# Patient Record
Sex: Female | Born: 1960
Health system: Southern US, Community
[De-identification: ages and names within clinical notes are randomized; demographics above are authoritative.]

## PROBLEM LIST (undated history)

## (undated) DIAGNOSIS — I10 Essential (primary) hypertension: Secondary | ICD-10-CM

## (undated) DIAGNOSIS — M1990 Unspecified osteoarthritis, unspecified site: Secondary | ICD-10-CM

## (undated) DIAGNOSIS — R011 Cardiac murmur, unspecified: Secondary | ICD-10-CM

## (undated) DIAGNOSIS — E079 Disorder of thyroid, unspecified: Secondary | ICD-10-CM

## (undated) HISTORY — DX: Cardiac murmur, unspecified: R01.1

## (undated) HISTORY — DX: Essential (primary) hypertension: I10

## (undated) HISTORY — DX: Disorder of thyroid, unspecified: E07.9

## (undated) HISTORY — PX: TOTAL ABDOMINAL HYSTERECTOMY: SHX209

## (undated) HISTORY — DX: Unspecified osteoarthritis, unspecified site: M19.90

## (undated) HISTORY — PX: ABDOMINAL HYSTERECTOMY: SHX81

---

## 2012-08-10 ENCOUNTER — Ambulatory Visit: Payer: Self-pay

## 2012-08-19 ENCOUNTER — Encounter: Payer: Self-pay | Admitting: *Deleted

## 2012-08-23 ENCOUNTER — Ambulatory Visit (INDEPENDENT_AMBULATORY_CARE_PROVIDER_SITE_OTHER): Payer: PRIVATE HEALTH INSURANCE | Admitting: General Surgery

## 2012-08-23 ENCOUNTER — Encounter: Payer: Self-pay | Admitting: General Surgery

## 2012-08-23 VITALS — BP 124/78 | HR 64 | Resp 14 | Ht 66.0 in | Wt 185.0 lb

## 2012-08-23 DIAGNOSIS — N63 Unspecified lump in unspecified breast: Secondary | ICD-10-CM

## 2012-08-23 NOTE — Progress Notes (Signed)
Patient ID: Allison Reynolds, female   DOB: [birth year], 52 y.o.   MRN: [id number]  Chief Complaint  Patient presents with  . Other    breast evaluation    HPI Allison Reynolds is a 52 y.o. female. Patient here today for breast evaluation referred by [facility] for a right breast mass. Mammogram and ultrasound 08-10-12 was normal. Patient states she can feel the "knot".  Denies family history of breast cancer. HPI  Past Medical History  Diagnosis Date  . Hypertension     Past Surgical History  Procedure Laterality Date  . Abdominal hysterectomy  age 86  . Cesarean section  1981, 1983    No family history on file.  Social History History  Substance Use Topics  . Smoking status: Current Every Day Smoker -- 2 years    Types: Cigarettes  . Smokeless tobacco: Never Used  . Alcohol Use: Yes     Comment: occasionally    No Known Allergies  Current Outpatient Prescriptions  Medication Sig Dispense Refill  . LISINOPRIL PO Take by mouth.      Marland Kitchen PROPRANOLOL HCL PO Take by mouth.       No current facility-administered medications for this visit.    Review of Systems Review of Systems  Constitutional: Negative.   Respiratory: Negative.   Cardiovascular: Negative.     Blood pressure 124/78, pulse 64, resp. rate 14, height 5\' 6"  (1.676 m), weight 185 lb (83.915 kg).  Physical Exam Physical Exam  Constitutional: She is oriented to person, place, and time. She appears well-developed and well-nourished.  Neck: No mass and no thyromegaly present.  Cardiovascular: Normal rate, regular rhythm and normal heart sounds.   Pulmonary/Chest: Effort normal and breath sounds normal. Right breast exhibits mass. Right breast exhibits no inverted nipple, no nipple discharge, no skin change and no tenderness. Left breast exhibits no inverted nipple, no nipple discharge, no skin change and no tenderness. Breasts are symmetrical.    Lymphadenopathy:    She has no cervical adenopathy.   She has no axillary adenopathy.  Neurological: She is alert and oriented to person, place, and time.  Skin: Skin is warm and dry.    Data Reviewed Mammogram and [place] of rightbreast reviewed-no abnormality  Assessment    Right breast mass, likely an area of fibrosis     Plan    Recommended FNA and completed today with pt's consent        Currie Paris 08/23/2012, 3:37 PM

## 2012-08-24 ENCOUNTER — Encounter: Payer: Self-pay | Admitting: General Surgery

## 2012-08-24 DIAGNOSIS — N63 Unspecified lump in unspecified breast: Secondary | ICD-10-CM | POA: Insufficient documentation

## 2012-08-25 ENCOUNTER — Encounter: Payer: Self-pay | Admitting: General Surgery

## 2012-08-31 ENCOUNTER — Telehealth: Payer: Self-pay | Admitting: *Deleted

## 2012-08-31 NOTE — Telephone Encounter (Signed)
Notified of benign breast results

## 2012-11-23 ENCOUNTER — Ambulatory Visit: Payer: PRIVATE HEALTH INSURANCE | Admitting: General Surgery

## 2012-12-29 ENCOUNTER — Ambulatory Visit: Payer: Self-pay | Admitting: General Surgery

## 2013-01-31 ENCOUNTER — Encounter: Payer: Self-pay | Admitting: *Deleted

## 2014-04-02 ENCOUNTER — Encounter: Payer: Self-pay | Admitting: General Surgery

## 2014-06-01 HISTORY — PX: BREAST BIOPSY: SHX20

## 2016-12-21 ENCOUNTER — Ambulatory Visit: Payer: Self-pay | Admitting: Unknown Physician Specialty

## 2017-01-12 ENCOUNTER — Ambulatory Visit (INDEPENDENT_AMBULATORY_CARE_PROVIDER_SITE_OTHER): Payer: BLUE CROSS/BLUE SHIELD | Admitting: Unknown Physician Specialty

## 2017-01-12 ENCOUNTER — Encounter: Payer: Self-pay | Admitting: Unknown Physician Specialty

## 2017-01-12 VITALS — BP 177/86 | HR 69 | Temp 98.4°F | Ht 65.6 in | Wt 191.8 lb

## 2017-01-12 DIAGNOSIS — Z7689 Persons encountering health services in other specified circumstances: Secondary | ICD-10-CM

## 2017-01-12 DIAGNOSIS — I10 Essential (primary) hypertension: Secondary | ICD-10-CM | POA: Diagnosis not present

## 2017-01-12 MED ORDER — LISINOPRIL-HYDROCHLOROTHIAZIDE 20-25 MG PO TABS: 1.0000 | ORAL_TABLET | Freq: Every day | ORAL | 0 refills | Status: DC

## 2017-01-12 MED ORDER — AMLODIPINE BESYLATE 5 MG PO TABS: 5.0000 mg | ORAL_TABLET | Freq: Every day | ORAL | 0 refills | Status: DC

## 2017-01-12 NOTE — Patient Instructions (Addendum)
Check insurance about hep C and HIV screening  Please do call to schedule your mammogram; the number to schedule one at either [facility] or [facility] is [phone]

## 2017-01-12 NOTE — Progress Notes (Signed)
BP (!) 177/86 (BP Location: Left Arm, Cuff Size: Large)   Pulse 69   Temp 98.4 F (36.9 C)   Ht 5' 5.6" (1.666 m)   Wt 191 lb 12.8 oz (87 kg)   LMP  (LMP Unknown)   SpO2 99%   BMI 31.34 kg/m    Subjective:    Patient ID: Allison Reynolds, female    DOB: [birth year], 58 y.o.   MRN: [id number]  HPI: Allison Reynolds is a 56 y.o. female  Chief Complaint  Patient presents with  . Establish Care  . Hypertension    pt states she used to be on lisinopril/hctz and amlodipine about a year ago    Hypertension Pt states she stopped taking her medications as she had to switch doctor's.  No side effects on those medications.   Average home BPs Not checking   No problems or lightheadedness No chest pain with exertion or shortness of breath No Edema No headaches.    Social History   Social History  . Marital status: Single    Spouse name: N/A  . Number of children: N/A  . Years of education: N/A   Occupational History  . Not on file.   Social History Main Topics  . Smoking status: Current Every Day Smoker    Years: 2.00    Types: Cigarettes  . Smokeless tobacco: Never Used     Comment: 1 pack per week  . Alcohol use Yes     Comment: occasionally  . Drug use: No  . Sexual activity: Yes   Other Topics Concern  . Not on file   Social History Narrative  . No narrative on file   Family History  Problem Relation Age of Onset  . Hypertension Mother   . Hypertension Father   . Heart murmur Father   . Hypertension Daughter   . Lupus Paternal Aunt     Past Medical History:  Diagnosis Date  . Arthritis   . Heart murmur   . Hypertension   . Thyroid disease    Past Surgical History:  Procedure Laterality Date  . ABDOMINAL HYSTERECTOMY  age 56  . CESAREAN SECTION  1981, 1983     Relevant past medical, surgical, family and social history reviewed and updated as indicated. Interim medical history since our last visit reviewed. Allergies and medications reviewed  and updated.  Review of Systems  Per HPI unless specifically indicated above     Objective:    BP (!) 177/86 (BP Location: Left Arm, Cuff Size: Large)   Pulse 69   Temp 98.4 F (36.9 C)   Ht 5' 5.6" (1.666 m)   Wt 191 lb 12.8 oz (87 kg)   LMP  (LMP Unknown)   SpO2 99%   BMI 31.34 kg/m   Wt Readings from Last 3 Encounters:  01/12/17 191 lb 12.8 oz (87 kg)  08/23/12 185 lb (83.9 kg)    Physical Exam  Constitutional: She is oriented to person, place, and time. She appears well-developed and well-nourished. No distress.  HENT:  Head: Normocephalic and atraumatic.  Eyes: Conjunctivae and lids are normal. Right eye exhibits no discharge. Left eye exhibits no discharge. No scleral icterus.  Neck: Normal range of motion. Neck supple. No JVD present. Carotid bruit is not present.  Cardiovascular: Normal rate, regular rhythm and normal heart sounds.   Pulmonary/Chest: Effort normal and breath sounds normal.  Abdominal: Normal appearance. There is no splenomegaly or hepatomegaly.  Musculoskeletal: Normal range of motion.  Neurological: She is alert and oriented to person, place, and time.  Skin: Skin is warm, dry and intact. No rash noted. No pallor.  Psychiatric: She has a normal mood and affect. Her behavior is normal. Judgment and thought content normal.      Assessment & Plan:   Problem List Items Addressed This Visit      Unprioritized   Encounter to establish care   Relevant Orders   MM DIGITAL SCREENING BILATERAL   Hypertension - Primary    Not to goal.  Restart both Lisinopril/HCTZ and Amlodipine      Relevant Medications   lisinopril-hydrochlorothiazide (PRINZIDE,ZESTORETIC) 20-25 MG tablet   amLODipine (NORVASC) 5 MG tablet   Other Relevant Orders   Comprehensive metabolic panel   TSH   Lipid Panel w/o Chol/HDL Ratio       Follow up plan: Return in about 4 weeks (around 02/09/2017).

## 2017-01-12 NOTE — Assessment & Plan Note (Signed)
Not to goal.  Restart both Lisinopril/HCTZ and Amlodipine

## 2017-01-13 LAB — LIPID PANEL W/O CHOL/HDL RATIO
Cholesterol, Total: 194 mg/dL (ref 100–199)
HDL: 75 mg/dL (ref >39)
LDL Calculated: 105 mg/dL — ABNORMAL HIGH (ref 0–99)
TRIGLYCERIDES: 68 mg/dL (ref 0–149)
VLDL CHOLESTEROL CAL: 14 mg/dL (ref 5–40)

## 2017-01-13 LAB — COMPREHENSIVE METABOLIC PANEL
ALBUMIN: 4.6 g/dL (ref 3.5–5.5)
ALT: 20 IU/L (ref 0–32)
AST: 21 IU/L (ref 0–40)
Albumin/Globulin Ratio: 1.8 (ref 1.2–2.2)
Alkaline Phosphatase: 70 IU/L (ref 39–117)
BUN / CREAT RATIO: 20 (ref 9–23)
BUN: 15 mg/dL (ref 6–24)
Bilirubin Total: 0.7 mg/dL (ref 0.0–1.2)
CALCIUM: 9.8 mg/dL (ref 8.7–10.2)
CHLORIDE: 101 mmol/L (ref 96–106)
CO2: 26 mmol/L (ref 20–29)
CREATININE: 0.76 mg/dL (ref 0.57–1.00)
GFR calc Af Amer: 101 mL/min/{1.73_m2} (ref >59)
GFR, EST NON AFRICAN AMERICAN: 87 mL/min/{1.73_m2} (ref >59)
GLOBULIN, TOTAL: 2.6 g/dL (ref 1.5–4.5)
GLUCOSE: 91 mg/dL (ref 65–99)
Potassium: 4.7 mmol/L (ref 3.5–5.2)
Sodium: 141 mmol/L (ref 134–144)
Total Protein: 7.2 g/dL (ref 6.0–8.5)

## 2017-01-13 LAB — TSH: TSH: 0.766 u[IU]/mL (ref 0.450–4.500)

## 2017-01-27 ENCOUNTER — Telehealth: Payer: Self-pay | Admitting: Family Medicine

## 2017-01-27 NOTE — Telephone Encounter (Signed)
Tried calling patient so Allison Reynolds could speak with her but she did not answer. I left her a VM letting her know that we would try to call her again Friday as Allison Reynolds is not in the office tomorrow.

## 2017-01-27 NOTE — Telephone Encounter (Signed)
Routing to provider. I do not see a mychart message or letter regarding labs. Please advise patient's other concern of dry mouth as well.

## 2017-01-27 NOTE — Telephone Encounter (Signed)
Patient would like a call back regarding her labs and also she has been having dry mouth and is wanting to know if there is something Allison MaxwellCheryl can recommend for it.  [phone]

## 2017-02-02 NOTE — Telephone Encounter (Signed)
Allison MaxwellCheryl would you like to contact this patient again?

## 2017-02-02 NOTE — Telephone Encounter (Signed)
No problem.  They were good and I will see her on f/u

## 2017-02-09 ENCOUNTER — Ambulatory Visit: Payer: BLUE CROSS/BLUE SHIELD | Admitting: Unknown Physician Specialty

## 2017-02-16 ENCOUNTER — Ambulatory Visit: Payer: BLUE CROSS/BLUE SHIELD | Admitting: Unknown Physician Specialty

## 2017-02-19 ENCOUNTER — Telehealth: Payer: Self-pay | Admitting: Unknown Physician Specialty

## 2017-02-19 NOTE — Telephone Encounter (Signed)
[facility] needing recent labs and proof of EKG faxed to facility.  Fax number: [phone]  Facility will fax request now.  Patient is at the office for a visit now.

## 2017-02-19 NOTE — Telephone Encounter (Signed)
No EKG in chart, labs faxed. Called and spoke to the office manager that called over. I let her know that we did not have an EKG but she stated that they went ahead and sent the patient to the hospital.

## 2017-02-23 ENCOUNTER — Ambulatory Visit (INDEPENDENT_AMBULATORY_CARE_PROVIDER_SITE_OTHER): Payer: BLUE CROSS/BLUE SHIELD | Admitting: Unknown Physician Specialty

## 2017-02-23 ENCOUNTER — Encounter: Payer: Self-pay | Admitting: Unknown Physician Specialty

## 2017-02-23 VITALS — BP 169/86 | HR 66 | Temp 97.9°F | Wt 192.6 lb

## 2017-02-23 DIAGNOSIS — I10 Essential (primary) hypertension: Secondary | ICD-10-CM | POA: Diagnosis not present

## 2017-02-23 DIAGNOSIS — R079 Chest pain, unspecified: Secondary | ICD-10-CM | POA: Diagnosis not present

## 2017-02-23 DIAGNOSIS — E785 Hyperlipidemia, unspecified: Secondary | ICD-10-CM | POA: Diagnosis not present

## 2017-02-23 DIAGNOSIS — R011 Cardiac murmur, unspecified: Secondary | ICD-10-CM

## 2017-02-23 MED ORDER — AMLODIPINE BESYLATE 10 MG PO TABS: 10.0000 mg | ORAL_TABLET | Freq: Every day | ORAL | 1 refills | Status: DC

## 2017-02-23 MED ORDER — ATORVASTATIN CALCIUM 20 MG PO TABS: 20.0000 mg | ORAL_TABLET | Freq: Every day | ORAL | 1 refills | Status: DC

## 2017-02-23 NOTE — Assessment & Plan Note (Signed)
Had for years.  Refer to cardiology in the setting of poorly controlled BP

## 2017-02-23 NOTE — Progress Notes (Signed)
BP (!) 169/86 (BP Location: Left Arm, Cuff Size: Normal)   Pulse 66   Temp 97.9 F (36.6 C)   Wt 192 lb 9.6 oz (87.4 kg)   LMP  (LMP Unknown)   SpO2 96%   BMI 31.47 kg/m    Subjective:    Patient ID: [name], female    DOB: August 29, 1960, 56 y.o.   MRN: [id number]  HPI: Allison Reynolds is a 56 y.o. female  Chief Complaint  Patient presents with  . Hypertension    4 week f/up   Hypertension Started on Lisinopril/HCTZ and Amlodipine last visit.  Takes medications daily.  Does not check BP at home.  States she went to urgent care for chest pain/heart racing/and breaking out in sweat.  EKG was normal.  Told to go to the ER but did not.  Her pain is better.  She thinks she overdid it and admits to being anxious  The 10-year ASCVD risk score Denman George DC Montez Hageman., et al., 2013) is: 20.5%   Values used to calculate the score:     Age: 67 years     Sex: Female     Is Non-Hispanic African American: Yes     Diabetic: No     Tobacco smoker: Yes     Systolic Blood Pressure: 169 mmHg     Is BP treated: Yes     HDL Cholesterol: 75 mg/dL     Total Cholesterol: 194 mg/dL   Relevant past medical, surgical, family and social history reviewed and updated as indicated. Interim medical history since our last visit reviewed. Allergies and medications reviewed and updated.  Review of Systems  Per HPI unless specifically indicated above     Objective:    BP (!) 169/86 (BP Location: Left Arm, Cuff Size: Normal)   Pulse 66   Temp 97.9 F (36.6 C)   Wt 192 lb 9.6 oz (87.4 kg)   LMP  (LMP Unknown)   SpO2 96%   BMI 31.47 kg/m   Wt Readings from Last 3 Encounters:  02/23/17 192 lb 9.6 oz (87.4 kg)  01/12/17 191 lb 12.8 oz (87 kg)  08/23/12 185 lb (83.9 kg)    Physical Exam  Constitutional: She is oriented to person, place, and time. She appears well-developed and well-nourished. No distress.  HENT:  Head: Normocephalic and atraumatic.  Eyes: Conjunctivae and lids are normal. Right  eye exhibits no discharge. Left eye exhibits no discharge. No scleral icterus.  Neck: Normal range of motion. Neck supple. No JVD present. Carotid bruit is not present.  Cardiovascular: Normal rate and regular rhythm.   Murmur heard.  Systolic murmur is present with a grade of 3/6  Pulmonary/Chest: Effort normal and breath sounds normal.  Abdominal: Normal appearance. There is no splenomegaly or hepatomegaly.  Musculoskeletal: Normal range of motion.  Neurological: She is alert and oriented to person, place, and time.  Skin: Skin is warm, dry and intact. No rash noted. No pallor.  Psychiatric: She has a normal mood and affect. Her behavior is normal. Judgment and thought content normal.   EKG reviewed from urgent care.  NSR without STTW changes------------------------------------------------------------------------------------  Results for orders placed or performed in visit on 01/12/17  Comprehensive metabolic panel  Result Value Ref Range   Glucose 91 65 - 99 mg/dL   BUN 15 6 - 24 mg/dL   Creatinine, Ser 4.09 0.57 - 1.00 mg/dL   GFR calc non Af Amer 87 >59 mL/min/1.73   GFR calc Af Denyse Dago  101 >59 mL/min/1.73   BUN/Creatinine Ratio 20 9 - 23   Sodium 141 134 - 144 mmol/L   Potassium 4.7 3.5 - 5.2 mmol/L   Chloride 101 96 - 106 mmol/L   CO2 26 20 - 29 mmol/L   Calcium 9.8 8.7 - 10.2 mg/dL   Total Protein 7.2 6.0 - 8.5 g/dL   Albumin 4.6 3.5 - 5.5 g/dL   Globulin, Total 2.6 1.5 - 4.5 g/dL   Albumin/Globulin Ratio 1.8 1.2 - 2.2   Bilirubin Total 0.7 0.0 - 1.2 mg/dL   Alkaline Phosphatase 70 39 - 117 IU/L   AST 21 0 - 40 IU/L   ALT 20 0 - 32 IU/L  TSH  Result Value Ref Range   TSH 0.766 0.450 - 4.500 uIU/mL  Lipid Panel w/o Chol/HDL Ratio  Result Value Ref Range   Cholesterol, Total 194 100 - 199 mg/dL   Triglycerides 68 0 - 149 mg/dL   HDL 75 >16 mg/dL   VLDL Cholesterol Cal 14 5 - 40 mg/dL   LDL Calculated 109 (H) 0 - 99 mg/dL      Assessment & Plan:   Problem List  Items Addressed This Visit      Unprioritized   Hyperlipidemia    ASCVD risk is 20.5%.  Start Atorvastain 20 mg      Relevant Medications   atorvastatin (LIPITOR) 20 MG tablet   amLODipine (NORVASC) 10 MG tablet   Hypertension    BP not to goal despite Amlodipine 5 mg and Lisinopril/HCTZ.  Increase Amlodipine to 10 mg.      Relevant Medications   atorvastatin (LIPITOR) 20 MG tablet   amLODipine (NORVASC) 10 MG tablet   Systolic murmur    Had for years.  Refer to cardiology in the setting of poorly controlled BP       Other Visit Diagnoses    Chest pain, unspecified type    -  Primary   Happened when working hard.  Refer to cardiology   Relevant Orders   Ambulatory referral to Cardiology       Follow up plan: Return in about 4 weeks (around 03/23/2017).

## 2017-02-23 NOTE — Assessment & Plan Note (Signed)
ASCVD risk is 20.5%.  Start Atorvastain 20 mg

## 2017-02-23 NOTE — Assessment & Plan Note (Signed)
BP not to goal despite Amlodipine 5 mg and Lisinopril/HCTZ.  Increase Amlodipine to 10 mg.

## 2017-03-30 ENCOUNTER — Ambulatory Visit: Payer: BLUE CROSS/BLUE SHIELD | Admitting: Unknown Physician Specialty

## 2017-04-25 ENCOUNTER — Other Ambulatory Visit: Payer: Self-pay | Admitting: Unknown Physician Specialty

## 2017-04-27 ENCOUNTER — Other Ambulatory Visit: Payer: Self-pay | Admitting: Unknown Physician Specialty

## 2017-04-27 ENCOUNTER — Ambulatory Visit: Payer: Self-pay | Admitting: *Deleted

## 2017-04-27 NOTE — Telephone Encounter (Signed)
Needs a refill of the Lisinopril and HCTZ.   She has an appt this Friday.   I will route a note to Gabriel Cirriheryl Wicker, NP to see if she can have a refill to get her through until Friday. She is going to call back and check on the status of the Rx.

## 2017-04-30 ENCOUNTER — Encounter: Payer: Self-pay | Admitting: Unknown Physician Specialty

## 2017-04-30 ENCOUNTER — Ambulatory Visit: Payer: BLUE CROSS/BLUE SHIELD | Admitting: Unknown Physician Specialty

## 2017-04-30 VITALS — BP 182/79 | HR 77 | Temp 97.6°F | Wt 194.8 lb

## 2017-04-30 DIAGNOSIS — Z1211 Encounter for screening for malignant neoplasm of colon: Secondary | ICD-10-CM

## 2017-04-30 DIAGNOSIS — I10 Essential (primary) hypertension: Secondary | ICD-10-CM

## 2017-04-30 DIAGNOSIS — E785 Hyperlipidemia, unspecified: Secondary | ICD-10-CM | POA: Diagnosis not present

## 2017-04-30 MED ORDER — CHLORTHALIDONE 25 MG PO TABS: 25.0000 mg | ORAL_TABLET | Freq: Every day | ORAL | 1 refills | Status: DC

## 2017-04-30 MED ORDER — LISINOPRIL 40 MG PO TABS: 40.0000 mg | ORAL_TABLET | Freq: Every day | ORAL | 3 refills | Status: DC

## 2017-04-30 NOTE — Progress Notes (Signed)
BP (!) 182/79   Pulse 77   Temp 97.6 F (36.4 C) (Oral)   Wt 194 lb 12.8 oz (88.4 kg)   LMP  (LMP Unknown)   SpO2 100%   BMI 31.83 kg/m    Subjective:    Patient ID: Allison Reynolds, female    DOB: 26-Jul-1960, 56 y.o.   MRN: [id number]  HPI: Allison Reynolds is a 56 y.o. female  Chief Complaint  Patient presents with  . Hyperlipidemia  . Hypertension   Hypertension Using medications without difficulty.  Taking Amlodipine plus Lisinopril/HCTZ Average home BPs High there as well   No problems or lightheadedness No chest pain with exertion or shortness of breath No Edema States when she loses weight, her BP goes down  Hyperlipidemia Last visit started on Atorvastatin Using medications without problems: No Muscle aches  Diet compliance:Exercise: Needs to exercise  Last visit pt was referred to the cardiologist for chest pain in the setting of difficult to control blood pressure.   Relevant past medical, surgical, family and social history reviewed and updated as indicated. Interim medical history since our last visit reviewed. Allergies and medications reviewed and updated.  Review of Systems  Constitutional: Negative.   HENT:       Bad tooth improved with Amoxil. She has a dental appt  Eyes: Negative.   Respiratory: Negative.   Cardiovascular: Negative.   Gastrointestinal: Negative.   Endocrine: Negative.   Genitourinary: Negative.   Musculoskeletal: Negative.   Skin: Negative.   Allergic/Immunologic: Negative.   Neurological: Negative.   Hematological: Negative.   Psychiatric/Behavioral: Negative.     Per HPI unless specifically indicated above     Objective:    BP (!) 182/79   Pulse 77   Temp 97.6 F (36.4 C) (Oral)   Wt 194 lb 12.8 oz (88.4 kg)   LMP  (LMP Unknown)   SpO2 100%   BMI 31.83 kg/m   Wt Readings from Last 3 Encounters:  04/30/17 194 lb 12.8 oz (88.4 kg)  02/23/17 192 lb 9.6 oz (87.4 kg)  01/12/17 191 lb 12.8 oz (87 kg)      Physical Exam  Constitutional: She is oriented to person, place, and time. She appears well-developed and well-nourished. No distress.  HENT:  Head: Normocephalic and atraumatic.  Eyes: Conjunctivae and lids are normal. Right eye exhibits no discharge. Left eye exhibits no discharge. No scleral icterus.  Neck: Normal range of motion. Neck supple. No JVD present. Carotid bruit is not present.  Cardiovascular: Normal rate, regular rhythm and normal heart sounds.  Pulmonary/Chest: Effort normal and breath sounds normal.  Abdominal: Normal appearance. There is no splenomegaly or hepatomegaly.  Musculoskeletal: Normal range of motion.  Neurological: She is alert and oriented to person, place, and time.  Skin: Skin is warm, dry and intact. No rash noted. No pallor.  Psychiatric: She has a normal mood and affect. Her behavior is normal. Judgment and thought content normal.    Results for orders placed or performed in visit on 01/12/17  Comprehensive metabolic panel  Result Value Ref Range   Glucose 91 65 - 99 mg/dL   BUN 15 6 - 24 mg/dL   Creatinine, Ser 9.140.76 0.57 - 1.00 mg/dL   GFR calc non Af Amer 87 >59 mL/min/1.73   GFR calc Af Amer 101 >59 mL/min/1.73   BUN/Creatinine Ratio 20 9 - 23   Sodium 141 134 - 144 mmol/L   Potassium 4.7 3.5 - 5.2 mmol/L   Chloride 101  96 - 106 mmol/L   CO2 26 20 - 29 mmol/L   Calcium 9.8 8.7 - 10.2 mg/dL   Total Protein 7.2 6.0 - 8.5 g/dL   Albumin 4.6 3.5 - 5.5 g/dL   Globulin, Total 2.6 1.5 - 4.5 g/dL   Albumin/Globulin Ratio 1.8 1.2 - 2.2   Bilirubin Total 0.7 0.0 - 1.2 mg/dL   Alkaline Phosphatase 70 39 - 117 IU/L   AST 21 0 - 40 IU/L   ALT 20 0 - 32 IU/L  TSH  Result Value Ref Range   TSH 0.766 0.450 - 4.500 uIU/mL  Lipid Panel w/o Chol/HDL Ratio  Result Value Ref Range   Cholesterol, Total 194 100 - 199 mg/dL   Triglycerides 68 0 - 149 mg/dL   HDL 75 >16>39 mg/dL   VLDL Cholesterol Cal 14 5 - 40 mg/dL   LDL Calculated 109105 (H) 0 - 99 mg/dL       Assessment & Plan:   Problem List Items Addressed This Visit      Unprioritized   Hyperlipidemia    Tolerating Atorvastatin well.  Will check Lipid panel next visit at physical      Relevant Medications   chlorthalidone (HYGROTON) 25 MG tablet   lisinopril (PRINIVIL,ZESTRIL) 40 MG tablet   Hypertension    BP under poor control without benefit of additional medication.  Change Lisinopril/Hctz to Lisinopril 40 and chlorthalidone 25 mg. Refusing cardiology consult at this time      Relevant Medications   chlorthalidone (HYGROTON) 25 MG tablet   lisinopril (PRINIVIL,ZESTRIL) 40 MG tablet    Other Visit Diagnoses    Colon cancer screening    -  Primary   Relevant Orders   Cologuard      HM: Schedule for Cologuard.  Told ot call insurance for coverage.    Follow up plan: Return in about 4 weeks (around 05/28/2017) for physical.

## 2017-04-30 NOTE — Patient Instructions (Signed)
Stop Lisinopril/HCTZ  Start Lisinopril 40 mg and Chlorthalidone 25 mg

## 2017-04-30 NOTE — Assessment & Plan Note (Signed)
Tolerating Atorvastatin well.  Will check Lipid panel next visit at physical

## 2017-04-30 NOTE — Assessment & Plan Note (Addendum)
BP under poor control without benefit of additional medication.  Change Lisinopril/Hctz to Lisinopril 40 and chlorthalidone 25 mg. Refusing cardiology consult at this time

## 2017-06-01 ENCOUNTER — Other Ambulatory Visit: Payer: Self-pay | Admitting: Unknown Physician Specialty

## 2017-06-04 ENCOUNTER — Telehealth: Payer: Self-pay

## 2017-06-04 DIAGNOSIS — Z1211 Encounter for screening for malignant neoplasm of colon: Secondary | ICD-10-CM

## 2017-06-04 NOTE — Telephone Encounter (Signed)
Copied from CRM [id number]. Topic: Inquiry >> Jun 04, 2017  2:31 PM Eston Mouldavis, Cheri B wrote: Reason for CRM: Pt received colorguard in mail, her insurance will not cover unless its done at facility in network.  PT is asking what should she do  now   Routing to provider. What can we tell the patient?

## 2017-06-04 NOTE — Telephone Encounter (Signed)
It means her insurance does not cover Cologuard and she needs a colonoscopy scheduled

## 2017-06-04 NOTE — Telephone Encounter (Signed)
Called and spoke with patient. She is OK with having colonoscopy. Routing back to provider for referral.

## 2017-06-14 ENCOUNTER — Other Ambulatory Visit: Payer: Self-pay

## 2017-06-14 ENCOUNTER — Telehealth: Payer: Self-pay | Admitting: Gastroenterology

## 2017-06-14 ENCOUNTER — Telehealth: Payer: Self-pay

## 2017-06-14 DIAGNOSIS — Z1211 Encounter for screening for malignant neoplasm of colon: Secondary | ICD-10-CM

## 2017-06-14 NOTE — Telephone Encounter (Signed)
Gastroenterology Pre-Procedure Review  Request Date: 06/23/17 Requesting Physician: Dr. Allegra [facility]aiVanga  PATIENT REVIEW QUESTIONS: The patient responded to the following health history questions as indicated:    1. Are you having any GI issues? no 2. Do you have a personal history of Polyps? no 3. Do you have a family history of Colon Cancer or Polyps? no 4. Diabetes Mellitus? no 5. Joint replacements in the past 12 months?no 6. Major health problems in the past 3 months?no 7. Any artificial heart valves, MVP, or defibrillator?no    MEDICATIONS & ALLERGIES:    Patient reports the following regarding taking any anticoagulation/antiplatelet therapy:   Plavix, Coumadin, Eliquis, Xarelto, Lovenox, Pradaxa, Brilinta, or Effient? no Aspirin? no  Patient confirms/reports the following medications:  Current Outpatient Medications  Medication Sig Dispense Refill  . amLODipine (NORVASC) 10 MG tablet TAKE 1 TABLET BY MOUTH ONCE DAILY 30 tablet 1  . atorvastatin (LIPITOR) 20 MG tablet Take 1 tablet (20 mg total) by mouth daily. 30 tablet 1  . chlorthalidone (HYGROTON) 25 MG tablet Take 1 tablet (25 mg total) by mouth daily. 30 tablet 1  . lisinopril (PRINIVIL,ZESTRIL) 40 MG tablet Take 1 tablet (40 mg total) by mouth daily. 90 tablet 3   No current facility-administered medications for this visit.     Patient confirms/reports the following allergies:  No Known Allergies  No orders of the defined types were placed in this encounter.   AUTHORIZATION INFORMATION Primary Insurance: 1D#: Group #:  Secondary Insurance: 1D#: Group #:  SCHEDULE INFORMATION: Date: 06/23/17 Time: Location:[place]

## 2017-06-14 NOTE — Telephone Encounter (Signed)
Patient to see about arranging transportation and will call back to schedule her colonoscopy.

## 2017-06-14 NOTE — Telephone Encounter (Signed)
Gastroenterology Pre-Procedure Review  Request Date:  Requesting Physician: Dr.   PATIENT REVIEW QUESTIONS: The patient responded to the following health history questions as indicated:    1. Are you having any GI issues? No  2. Do you have a personal history of Polyps? No  3. Do you have a family history of Colon Cancer or Polyps? No  4. Diabetes Mellitus? No  5. Joint replacements in the past 12 months? No  6. Major health problems in the past 3 months? No  7. Any artificial heart valves, MVP, or defibrillator? No     MEDICATIONS & ALLERGIES:    Patient reports the following regarding taking any anticoagulation/antiplatelet therapy:   Plavix, Coumadin, Eliquis, Xarelto, Lovenox, Pradaxa, Brilinta, or Effient? No  Aspirin? No    Patient confirms/reports the following medications:  Current Outpatient Medications  Medication Sig Dispense Refill  . amLODipine (NORVASC) 10 MG tablet TAKE 1 TABLET BY MOUTH ONCE DAILY 30 tablet 1  . atorvastatin (LIPITOR) 20 MG tablet Take 1 tablet (20 mg total) by mouth daily. 30 tablet 1  . chlorthalidone (HYGROTON) 25 MG tablet Take 1 tablet (25 mg total) by mouth daily. 30 tablet 1  . lisinopril (PRINIVIL,ZESTRIL) 40 MG tablet Take 1 tablet (40 mg total) by mouth daily. 90 tablet 3   No current facility-administered medications for this visit.     Patient confirms/reports the following allergies:  No Known Allergies  No orders of the defined types were placed in this encounter.   AUTHORIZATION INFORMATION Primary Insurance: 1D#: Group #:  Secondary Insurance: 1D#: Group #:  SCHEDULE INFORMATION: Date:  Time: Location:

## 2017-06-14 NOTE — Telephone Encounter (Signed)
Patient is returning a call to schedule a colonoscopy °

## 2017-06-15 ENCOUNTER — Ambulatory Visit: Payer: BLUE CROSS/BLUE SHIELD | Admitting: Unknown Physician Specialty

## 2017-06-15 ENCOUNTER — Encounter: Payer: Self-pay | Admitting: Unknown Physician Specialty

## 2017-06-15 VITALS — BP 149/86 | HR 75 | Temp 97.7°F | Ht 65.5 in | Wt 192.3 lb

## 2017-06-15 DIAGNOSIS — R002 Palpitations: Secondary | ICD-10-CM | POA: Diagnosis not present

## 2017-06-15 DIAGNOSIS — E785 Hyperlipidemia, unspecified: Secondary | ICD-10-CM | POA: Diagnosis not present

## 2017-06-15 DIAGNOSIS — I10 Essential (primary) hypertension: Secondary | ICD-10-CM | POA: Diagnosis not present

## 2017-06-15 DIAGNOSIS — Z0000 Encounter for general adult medical examination without abnormal findings: Secondary | ICD-10-CM

## 2017-06-15 DIAGNOSIS — I493 Ventricular premature depolarization: Secondary | ICD-10-CM

## 2017-06-15 DIAGNOSIS — Z0001 Encounter for general adult medical examination with abnormal findings: Secondary | ICD-10-CM | POA: Diagnosis not present

## 2017-06-15 MED ORDER — CHLORTHALIDONE 25 MG PO TABS: 25.0000 mg | ORAL_TABLET | Freq: Every day | ORAL | 1 refills | Status: DC

## 2017-06-15 NOTE — Progress Notes (Signed)
BP (!) 149/86 (BP Location: Left Arm, Cuff Size: Large)   Pulse 75   Temp 97.7 F (36.5 C) (Oral)   Ht 5' 5.5" (1.664 m)   Wt 192 lb 4.8 oz (87.2 kg)   LMP  (LMP Unknown)   SpO2 96%   BMI 31.51 kg/m    Subjective:    Patient ID: Allison Reynolds, female    DOB: 1960-07-25, 57 y.o.   MRN: [id number]  HPI: Allison Reynolds is a 57 y.o. female  Chief Complaint  Patient presents with  . Annual Exam   HypertensionI Pt is taking Amlodipine and Chlorthalidone.  Not taking Lisinopril at this time Using medications without difficulty Average home BPs this AM SBP was 168 in one arm and 158/80 in the other arm  No problems or lightheadedness No chest pain with exertion or shortness of breath No Edema   Hyperlipidemia Using medications without problems: No Muscle aches  Diet compliance:Exercise: dong some exercise.    Depression screen [facility] 2/9 06/15/2017 01/12/2017  Decreased Interest 0 0  Down, Depressed, Hopeless 0 2  PHQ - 2 Score 0 2  Altered sleeping 1 3  Tired, decreased energy 1 0  Change in appetite 0 0  Feeling bad or failure about yourself  0 2  Trouble concentrating 0 0  Moving slowly or fidgety/restless 0 0  Suicidal thoughts 0 1  PHQ-9 Score 2 8   Social History   Socioeconomic History  . Marital status: Reynolds    Spouse name: Not on file  . Number of children: Not on file  . Years of education: Not on file  . Highest education level: Not on file  Social Needs  . Financial resource strain: Not on file  . Food insecurity - worry: Not on file  . Food insecurity - inability: Not on file  . Transportation needs - medical: Not on file  . Transportation needs - non-medical: Not on file  Occupational History  . Not on file  Tobacco Use  . Smoking status: Current Every Day Smoker    Years: 2.00    Types: Cigarettes  . Smokeless tobacco: Never Used  . Tobacco comment: 1 pack per week  Substance and Sexual Activity  . Alcohol use: Yes    Comment:  occasionally  . Drug use: No  . Sexual activity: Yes  Other Topics Concern  . Not on file  Social History Narrative  . Not on file   Family History  Problem Relation Age of Onset  . Hypertension Mother   . Hypertension Father   . Heart murmur Father   . Hypertension Daughter   . Lupus Paternal Aunt    Past Surgical History:  Procedure Laterality Date  . ABDOMINAL HYSTERECTOMY  age 6  . CESAREAN SECTION  1981, 1983   Past Medical History:  Diagnosis Date  . Arthritis   . Heart murmur   . Hypertension   . Thyroid disease      Relevant past medical, surgical, family and social history reviewed and updated as indicated. Interim medical history since our last visit reviewed. Allergies and medications reviewed and updated.  Review of Systems  Constitutional: Negative.   HENT: Negative.   Eyes: Negative.   Respiratory: Negative.   Cardiovascular: Negative.   Gastrointestinal: Negative.   Endocrine: Negative.   Genitourinary: Negative.   Musculoskeletal: Negative.   Skin: Negative.   Allergic/Immunologic: Negative.   Neurological: Negative.   Hematological: Negative.   Psychiatric/Behavioral: Negative.  Per HPI unless specifically indicated above     Objective:    BP (!) 149/86 (BP Location: Left Arm, Cuff Size: Large)   Pulse 75   Temp 97.7 F (36.5 C) (Oral)   Ht 5' 5.5" (1.664 m)   Wt 192 lb 4.8 oz (87.2 kg)   LMP  (LMP Unknown)   SpO2 96%   BMI 31.51 kg/m   Wt Readings from Last 3 Encounters:  06/15/17 192 lb 4.8 oz (87.2 kg)  04/30/17 194 lb 12.8 oz (88.4 kg)  02/23/17 192 lb 9.6 oz (87.4 kg)    Physical Exam  Constitutional: She is oriented to person, place, and time. She appears well-developed and well-nourished. No distress.  HENT:  Head: Normocephalic and atraumatic.  Eyes: Conjunctivae and lids are normal. Right eye exhibits no discharge. Left eye exhibits no discharge. No scleral icterus.  Neck: Normal range of motion. Neck supple. No  JVD present. Carotid bruit is not present.  Cardiovascular: Normal rate and normal heart sounds. An irregular rhythm present.  EKG shows ventricular trigeminy.  Possible ST changes  Pulmonary/Chest: Effort normal and breath sounds normal.  Abdominal: Normal appearance. There is no splenomegaly or hepatomegaly.  Musculoskeletal: Normal range of motion.  Neurological: She is alert and oriented to person, place, and time.  Skin: Skin is warm, dry and intact. No rash noted. No pallor.  Psychiatric: She has a normal mood and affect. Her behavior is normal. Judgment and thought content normal.    Results for orders placed or performed in visit on 01/12/17  Comprehensive metabolic panel  Result Value Ref Range   Glucose 91 65 - 99 mg/dL   BUN 15 6 - 24 mg/dL   Creatinine, Ser 4.78 0.57 - 1.00 mg/dL   GFR calc non Af Amer 87 >59 mL/min/1.73   GFR calc Af Amer 101 >59 mL/min/1.73   BUN/Creatinine Ratio 20 9 - 23   Sodium 141 134 - 144 mmol/L   Potassium 4.7 3.5 - 5.2 mmol/L   Chloride 101 96 - 106 mmol/L   CO2 26 20 - 29 mmol/L   Calcium 9.8 8.7 - 10.2 mg/dL   Total Protein 7.2 6.0 - 8.5 g/dL   Albumin 4.6 3.5 - 5.5 g/dL   Globulin, Total 2.6 1.5 - 4.5 g/dL   Albumin/Globulin Ratio 1.8 1.2 - 2.2   Bilirubin Total 0.7 0.0 - 1.2 mg/dL   Alkaline Phosphatase 70 39 - 117 IU/L   AST 21 0 - 40 IU/L   ALT 20 0 - 32 IU/L  TSH  Result Value Ref Range   TSH 0.766 0.450 - 4.500 uIU/mL  Lipid Panel w/o Chol/HDL Ratio  Result Value Ref Range   Cholesterol, Total 194 100 - 199 mg/dL   Triglycerides 68 0 - 149 mg/dL   HDL 75 >29 mg/dL   VLDL Cholesterol Cal 14 5 - 40 mg/dL   LDL Calculated 562 (H) 0 - 99 mg/dL      Assessment & Plan:   Problem List Items Addressed This Visit      Unprioritized   Hyperlipidemia   Relevant Medications   chlorthalidone (HYGROTON) 25 MG tablet   Other Relevant Orders   Lipid Panel w/o Chol/HDL Ratio   Hypertension    Improved but not to goal.  Restart  Lisinopril along with Amlodipine and Chlorthalidone      Relevant Medications   chlorthalidone (HYGROTON) 25 MG tablet   Other Relevant Orders   Comprehensive metabolic panel   TSH  PVC (premature ventricular contraction)    With questionable ST depression.  Due to high risk, refer to cardiology      Relevant Medications   chlorthalidone (HYGROTON) 25 MG tablet   Other Relevant Orders   Ambulatory referral to Cardiology    Other Visit Diagnoses    Palpitations    -  Primary   Relevant Orders   TSH   EKG 12-Lead (Completed)   Ambulatory referral to Cardiology   Routine general medical examination at a health care facility       Relevant Orders   Hepatitis C antibody   HIV antibody   CBC with Differential/Platelet   MM DIGITAL SCREENING BILATERAL       Follow up plan: Return in about 4 weeks (around 07/13/2017).

## 2017-06-15 NOTE — Assessment & Plan Note (Signed)
Improved but not to goal.  Restart Lisinopril along with Amlodipine and Chlorthalidone

## 2017-06-15 NOTE — Patient Instructions (Signed)
Please do call to schedule your mammogram; the number to schedule one at either [facility] or [facility] is [phone]   

## 2017-06-15 NOTE — Assessment & Plan Note (Signed)
With questionable ST depression.  Due to high risk, refer to cardiology

## 2017-06-16 ENCOUNTER — Encounter: Payer: Self-pay | Admitting: Unknown Physician Specialty

## 2017-06-16 LAB — CBC WITH DIFFERENTIAL/PLATELET
BASOS ABS: 0 10*3/uL (ref 0.0–0.2)
Basos: 1 %
EOS (ABSOLUTE): 0 10*3/uL (ref 0.0–0.4)
EOS: 1 %
HEMATOCRIT: 40.2 % (ref 34.0–46.6)
HEMOGLOBIN: 13.2 g/dL (ref 11.1–15.9)
Immature Grans (Abs): 0 10*3/uL (ref 0.0–0.1)
Immature Granulocytes: 0 %
Lymphocytes Absolute: 1.7 10*3/uL (ref 0.7–3.1)
Lymphs: 33 %
MCH: 27.3 pg (ref 26.6–33.0)
MCHC: 32.8 g/dL (ref 31.5–35.7)
MCV: 83 fL (ref 79–97)
MONOCYTES: 8 %
MONOS ABS: 0.4 10*3/uL (ref 0.1–0.9)
NEUTROS ABS: 2.9 10*3/uL (ref 1.4–7.0)
Neutrophils: 57 %
Platelets: 297 10*3/uL (ref 150–379)
RBC: 4.84 x10E6/uL (ref 3.77–5.28)
RDW: 12.8 % (ref 12.3–15.4)
WBC: 5 10*3/uL (ref 3.4–10.8)

## 2017-06-16 LAB — COMPREHENSIVE METABOLIC PANEL
ALBUMIN: 4.9 g/dL (ref 3.5–5.5)
ALK PHOS: 65 IU/L (ref 39–117)
ALT: 27 IU/L (ref 0–32)
AST: 27 IU/L (ref 0–40)
Albumin/Globulin Ratio: 2 (ref 1.2–2.2)
BUN / CREAT RATIO: 14 (ref 9–23)
BUN: 10 mg/dL (ref 6–24)
Bilirubin Total: 0.6 mg/dL (ref 0.0–1.2)
CO2: 27 mmol/L (ref 20–29)
Calcium: 9.8 mg/dL (ref 8.7–10.2)
Chloride: 95 mmol/L — ABNORMAL LOW (ref 96–106)
Creatinine, Ser: 0.69 mg/dL (ref 0.57–1.00)
GFR calc Af Amer: 111 mL/min/{1.73_m2} (ref >59)
GFR calc non Af Amer: 96 mL/min/{1.73_m2} (ref >59)
GLOBULIN, TOTAL: 2.5 g/dL (ref 1.5–4.5)
Glucose: 98 mg/dL (ref 65–99)
POTASSIUM: 3.4 mmol/L — AB (ref 3.5–5.2)
SODIUM: 139 mmol/L (ref 134–144)
Total Protein: 7.4 g/dL (ref 6.0–8.5)

## 2017-06-16 LAB — TSH: TSH: 0.758 u[IU]/mL (ref 0.450–4.500)

## 2017-06-16 LAB — LIPID PANEL W/O CHOL/HDL RATIO
CHOLESTEROL TOTAL: 156 mg/dL (ref 100–199)
HDL: 71 mg/dL (ref >39)
LDL CALC: 75 mg/dL (ref 0–99)
TRIGLYCERIDES: 48 mg/dL (ref 0–149)
VLDL Cholesterol Cal: 10 mg/dL (ref 5–40)

## 2017-06-16 LAB — HIV ANTIBODY (ROUTINE TESTING W REFLEX): HIV Screen 4th Generation wRfx: NONREACTIVE

## 2017-06-16 LAB — HEPATITIS C ANTIBODY: Hep C Virus Ab: <0.1 s/co ratio (ref 0.0–0.9)

## 2017-06-16 NOTE — Progress Notes (Signed)
Normal labs.  Patient notified by letter.

## 2017-07-06 NOTE — Discharge Instructions (Signed)
General Anesthesia, Adult, Care After °These instructions provide you with information about caring for yourself after your procedure. Your health care provider may also give you more specific instructions. Your treatment has been planned according to current medical practices, but problems sometimes occur. Call your health care provider if you have any problems or questions after your procedure. °What can I expect after the procedure? °After the procedure, it is common to have: °· Vomiting. °· A sore throat. °· Mental slowness. ° °It is common to feel: °· Nauseous. °· Cold or shivery. °· Sleepy. °· Tired. °· Sore or achy, even in parts of your body where you did not have surgery. ° °Follow these instructions at home: °For at least 24 hours after the procedure: °· Do not: °? Participate in activities where you could fall or become injured. °? Drive. °? Use heavy machinery. °? Drink alcohol. °? Take sleeping pills or medicines that cause drowsiness. °? Make important decisions or sign legal documents. °? Take care of children on your own. °· Rest. °Eating and drinking °· If you vomit, drink water, juice, or soup when you can drink without vomiting. °· Drink enough fluid to keep your urine clear or pale yellow. °· Make sure you have little or no nausea before eating solid foods. °· Follow the diet recommended by your health care provider. °General instructions °· Have a responsible adult stay with you until you are awake and alert. °· Return to your normal activities as told by your health care provider. Ask your health care provider what activities are safe for you. °· Take over-the-counter and prescription medicines only as told by your health care provider. °· If you smoke, do not smoke without supervision. °· Keep all follow-up visits as told by your health care provider. This is important. °Contact a health care provider if: °· You continue to have nausea or vomiting at home, and medicines are not helpful. °· You  cannot drink fluids or start eating again. °· You cannot urinate after 8-12 hours. °· You develop a skin rash. °· You have fever. °· You have increasing redness at the site of your procedure. °Get help right away if: °· You have difficulty breathing. °· You have chest pain. °· You have unexpected bleeding. °· You feel that you are having a life-threatening or urgent problem. °This information is not intended to replace advice given to you by your health care provider. Make sure you discuss any questions you have with your health care provider. °Document Released: 08/24/2000 Document Revised: 10/21/2015 Document Reviewed: 05/02/2015 °Elsevier Interactive Patient Education © 2018 [employer] ° °

## 2017-07-07 ENCOUNTER — Ambulatory Visit
Admission: RE | Admit: 2017-07-07 | Payer: BLUE CROSS/BLUE SHIELD | Source: Ambulatory Visit | Admitting: Gastroenterology

## 2017-07-07 ENCOUNTER — Encounter: Admission: RE | Payer: Self-pay | Source: Ambulatory Visit

## 2017-07-07 SURGERY — COLONOSCOPY WITH PROPOFOL
Anesthesia: General

## 2017-07-13 ENCOUNTER — Ambulatory Visit: Payer: BLUE CROSS/BLUE SHIELD | Admitting: Unknown Physician Specialty

## 2017-07-15 ENCOUNTER — Ambulatory Visit
Admission: RE | Admit: 2017-07-15 | Discharge: 2017-07-15 | Disposition: A | Discharge status: Home / Self Care | Payer: BLUE CROSS/BLUE SHIELD | Source: Ambulatory Visit | Attending: Unknown Physician Specialty | Admitting: Unknown Physician Specialty

## 2017-07-15 DIAGNOSIS — Z0000 Encounter for general adult medical examination without abnormal findings: Secondary | ICD-10-CM

## 2017-07-15 DIAGNOSIS — Z1231 Encounter for screening mammogram for malignant neoplasm of breast: Secondary | ICD-10-CM | POA: Diagnosis present

## 2017-08-11 ENCOUNTER — Telehealth: Payer: Self-pay | Admitting: Unknown Physician Specialty

## 2017-08-11 NOTE — Telephone Encounter (Signed)
Needs follow up appointment.  

## 2017-08-11 NOTE — Telephone Encounter (Signed)
Pt calling back about medicine until her appt

## 2017-08-11 NOTE — Telephone Encounter (Signed)
Pt has appt scheduled on 3/19. Pt states she will be out of medication before seen for appt and wants to know if she could be given enough medication to last until seen at appt. Pt requesting temporary supply of Amlodipine and Atorvastatin.

## 2017-08-11 NOTE — Telephone Encounter (Signed)
Left message for patient to call to schedule follow up appointment for the medication refills

## 2017-08-12 NOTE — Telephone Encounter (Signed)
Patient calling checking status. Please advise [phone]

## 2017-08-12 NOTE — Telephone Encounter (Signed)
Spoke with patient to have her contact her pharmacy regarding the refills sent in on 3/13

## 2017-08-17 ENCOUNTER — Encounter: Payer: Self-pay | Admitting: Family Medicine

## 2017-08-17 ENCOUNTER — Ambulatory Visit (INDEPENDENT_AMBULATORY_CARE_PROVIDER_SITE_OTHER): Payer: BLUE CROSS/BLUE SHIELD | Admitting: Family Medicine

## 2017-08-17 VITALS — BP 157/91 | HR 61 | Temp 98.7°F | Wt 193.4 lb

## 2017-08-17 DIAGNOSIS — E785 Hyperlipidemia, unspecified: Secondary | ICD-10-CM

## 2017-08-17 DIAGNOSIS — I10 Essential (primary) hypertension: Secondary | ICD-10-CM

## 2017-08-17 DIAGNOSIS — I493 Ventricular premature depolarization: Secondary | ICD-10-CM

## 2017-08-17 MED ORDER — AMLODIPINE BESYLATE 10 MG PO TABS: 10.0000 mg | ORAL_TABLET | Freq: Every day | ORAL | 1 refills | Status: DC

## 2017-08-17 MED ORDER — LISINOPRIL 10 MG PO TABS: 10.0000 mg | ORAL_TABLET | Freq: Every day | ORAL | 3 refills | Status: DC

## 2017-08-17 MED ORDER — ATORVASTATIN CALCIUM 20 MG PO TABS: 20.0000 mg | ORAL_TABLET | Freq: Every day | ORAL | 1 refills | Status: DC

## 2017-08-17 NOTE — Assessment & Plan Note (Signed)
Not interested in seeing cardiology at this time. Warning signs discussed. Will call with any concerns. Rhythm normal today.

## 2017-08-17 NOTE — Assessment & Plan Note (Signed)
Not under good control. Did not take her lisinopril. Will refill lisinopril, continue amlodipine and chlorthalidone and recheck 1 month. Call with any concerns. Will check BMP at that time.

## 2017-08-17 NOTE — Assessment & Plan Note (Signed)
Normal last visit. Doing well on the lipitor. Continue to monitor. Refill given. Recheck 1 month.

## 2017-08-17 NOTE — Progress Notes (Signed)
BP (!) 157/91 (BP Location: Left Arm, Patient Position: Sitting, Cuff Size: Normal)   Pulse 61   Temp 98.7 F (37.1 C) (Oral)   Wt 193 lb 7 oz (87.7 kg)   LMP  (LMP Unknown)   SpO2 100%   BMI 31.70 kg/m    Subjective:    Patient ID: Allison Reynolds, female    DOB: [birth year], 57 y.o.   MRN: [id number]  HPI: Allison Reynolds is a 57 y.o. female  Chief Complaint  Patient presents with  . Hypertension    follow-up   Patient presents today for evaluation as she states she does not want to see previous provider. She notes that she did not restart her lisinopril. Went to the pharmacy to pick it up, but states that it was not there. Has been taking her chlorthalidone and amlodipine. Last visit was complaining of palpitations with trigeminy on her EKG. Was referred to cardiology, but despite several phone calls, did not go to see them. She notes that she is feeling well and she doesn't want to see cardiology at this time.   HYPERTENSION / HYPERLIPIDEMIA Satisfied with current treatment? no Duration of hypertension: chronic BP monitoring frequency: not checking BP medication side effects: no Past BP meds: lisinopril (not taking), amlodipine and chlorthalidone Duration of hyperlipidemia: chronic Cholesterol medication side effects: no Cholesterol supplements: none Past cholesterol medications: atorvastatin Medication compliance: good compliance Aspirin: no Recent stressors: no Recurrent headaches: no Visual changes: no Palpitations: yes Dyspnea: no Chest pain: no Lower extremity edema: no Dizzy/lightheaded: no  Relevant past medical, surgical, family and social history reviewed and updated as indicated. Interim medical history since our last visit reviewed. Allergies and medications reviewed and updated.  Review of Systems  Constitutional: Negative.   Respiratory: Negative.   Cardiovascular: Negative.   Psychiatric/Behavioral: Negative.     Per HPI unless specifically  indicated above     Objective:    BP (!) 157/91 (BP Location: Left Arm, Patient Position: Sitting, Cuff Size: Normal)   Pulse 61   Temp 98.7 F (37.1 C) (Oral)   Wt 193 lb 7 oz (87.7 kg)   LMP  (LMP Unknown)   SpO2 100%   BMI 31.70 kg/m   Wt Readings from Last 3 Encounters:  08/17/17 193 lb 7 oz (87.7 kg)  06/15/17 192 lb 4.8 oz (87.2 kg)  04/30/17 194 lb 12.8 oz (88.4 kg)    Physical Exam  Constitutional: She is oriented to person, place, and time. She appears well-developed and well-nourished. No distress.  HENT:  Head: Normocephalic and atraumatic.  Right Ear: Hearing normal.  Left Ear: Hearing normal.  Nose: Nose normal.  Eyes: Conjunctivae and lids are normal. Right eye exhibits no discharge. Left eye exhibits no discharge. No scleral icterus.  Cardiovascular: Normal rate, regular rhythm and intact distal pulses. Exam reveals no gallop and no friction rub.  Murmur heard. Pulmonary/Chest: Effort normal and breath sounds normal. No respiratory distress. She has no wheezes. She has no rales. She exhibits no tenderness.  Musculoskeletal: Normal range of motion.  Neurological: She is alert and oriented to person, place, and time.  Skin: Skin is warm, dry and intact. No rash noted. She is not diaphoretic. No erythema. No pallor.  Psychiatric: She has a normal mood and affect. Her speech is normal and behavior is normal. Judgment and thought content normal. Cognition and memory are normal.  Nursing note and vitals reviewed.   Results for orders placed or performed in visit on 06/15/17  Hepatitis C antibody  Result Value Ref Range   Hep C Virus Ab <0.1 0.0 - 0.9 s/co ratio  HIV antibody  Result Value Ref Range   HIV Screen 4th Generation wRfx Non Reactive Non Reactive  Lipid Panel w/o Chol/HDL Ratio  Result Value Ref Range   Cholesterol, Total 156 100 - 199 mg/dL   Triglycerides 48 0 - 149 mg/dL   HDL 71 >16 mg/dL   VLDL Cholesterol Cal 10 5 - 40 mg/dL   LDL Calculated  75 0 - 99 mg/dL  CBC with Differential/Platelet  Result Value Ref Range   WBC 5.0 3.4 - 10.8 x10E3/uL   RBC 4.84 3.77 - 5.28 x10E6/uL   Hemoglobin 13.2 11.1 - 15.9 g/dL   Hematocrit 10.9 60.4 - 46.6 %   MCV 83 79 - 97 fL   MCH 27.3 26.6 - 33.0 pg   MCHC 32.8 31.5 - 35.7 g/dL   RDW 54.0 98.1 - 19.1 %   Platelets 297 150 - 379 x10E3/uL   Neutrophils 57 Not Estab. %   Lymphs 33 Not Estab. %   Monocytes 8 Not Estab. %   Eos 1 Not Estab. %   Basos 1 Not Estab. %   Neutrophils Absolute 2.9 1.4 - 7.0 x10E3/uL   Lymphocytes Absolute 1.7 0.7 - 3.1 x10E3/uL   Monocytes Absolute 0.4 0.1 - 0.9 x10E3/uL   EOS (ABSOLUTE) 0.0 0.0 - 0.4 x10E3/uL   Basophils Absolute 0.0 0.0 - 0.2 x10E3/uL   Immature Granulocytes 0 Not Estab. %   Immature Grans (Abs) 0.0 0.0 - 0.1 x10E3/uL  Comprehensive metabolic panel  Result Value Ref Range   Glucose 98 65 - 99 mg/dL   BUN 10 6 - 24 mg/dL   Creatinine, Ser 4.78 0.57 - 1.00 mg/dL   GFR calc non Af Amer 96 >59 mL/min/1.73   GFR calc Af Amer 111 >59 mL/min/1.73   BUN/Creatinine Ratio 14 9 - 23   Sodium 139 134 - 144 mmol/L   Potassium 3.4 (L) 3.5 - 5.2 mmol/L   Chloride 95 (L) 96 - 106 mmol/L   CO2 27 20 - 29 mmol/L   Calcium 9.8 8.7 - 10.2 mg/dL   Total Protein 7.4 6.0 - 8.5 g/dL   Albumin 4.9 3.5 - 5.5 g/dL   Globulin, Total 2.5 1.5 - 4.5 g/dL   Albumin/Globulin Ratio 2.0 1.2 - 2.2   Bilirubin Total 0.6 0.0 - 1.2 mg/dL   Alkaline Phosphatase 65 39 - 117 IU/L   AST 27 0 - 40 IU/L   ALT 27 0 - 32 IU/L  TSH  Result Value Ref Range   TSH 0.758 0.450 - 4.500 uIU/mL      Assessment & Plan:   Problem List Items Addressed This Visit      Cardiovascular and Mediastinum   Hypertension - Primary    Not under good control. Did not take her lisinopril. Will refill lisinopril, continue amlodipine and chlorthalidone and recheck 1 month. Call with any concerns. Will check BMP at that time.       Relevant Medications   lisinopril (PRINIVIL,ZESTRIL) 10 MG  tablet   atorvastatin (LIPITOR) 20 MG tablet   amLODipine (NORVASC) 10 MG tablet   PVC (premature ventricular contraction)    Not interested in seeing cardiology at this time. Warning signs discussed. Will call with any concerns. Rhythm normal today.      Relevant Medications   lisinopril (PRINIVIL,ZESTRIL) 10 MG tablet   atorvastatin (LIPITOR) 20 MG tablet  amLODipine (NORVASC) 10 MG tablet     Other   Hyperlipidemia    Normal last visit. Doing well on the lipitor. Continue to monitor. Refill given. Recheck 1 month.       Relevant Medications   lisinopril (PRINIVIL,ZESTRIL) 10 MG tablet   atorvastatin (LIPITOR) 20 MG tablet   amLODipine (NORVASC) 10 MG tablet       Follow up plan: Return in about 4 weeks (around 09/14/2017) for follow up BP.

## 2017-09-14 ENCOUNTER — Ambulatory Visit (INDEPENDENT_AMBULATORY_CARE_PROVIDER_SITE_OTHER): Payer: BLUE CROSS/BLUE SHIELD | Admitting: Unknown Physician Specialty

## 2017-09-14 ENCOUNTER — Encounter: Payer: Self-pay | Admitting: Unknown Physician Specialty

## 2017-09-14 VITALS — BP 155/76 | HR 76 | Temp 98.4°F | Ht 65.5 in | Wt 193.3 lb

## 2017-09-14 DIAGNOSIS — R252 Cramp and spasm: Secondary | ICD-10-CM

## 2017-09-14 DIAGNOSIS — I10 Essential (primary) hypertension: Secondary | ICD-10-CM | POA: Diagnosis not present

## 2017-09-14 DIAGNOSIS — J069 Acute upper respiratory infection, unspecified: Secondary | ICD-10-CM

## 2017-09-14 MED ORDER — LISINOPRIL 20 MG PO TABS: 20.0000 mg | ORAL_TABLET | Freq: Every day | ORAL | 3 refills | Status: DC

## 2017-09-14 NOTE — Assessment & Plan Note (Signed)
Written information on Magnesium

## 2017-09-14 NOTE — Patient Instructions (Addendum)
DASH Eating Plan DASH stands for "Dietary Approaches to Stop Hypertension." The DASH eating plan is a healthy eating plan that has been shown to reduce high blood pressure (hypertension). It may also reduce your risk for type 2 diabetes, heart disease, and stroke. The DASH eating plan may also help with weight loss. What are tips for following this plan? General guidelines  Avoid eating more than 2,300 mg (milligrams) of salt (sodium) a day. If you have hypertension, you may need to reduce your sodium intake to 1,500 mg a day.  Limit alcohol intake to no more than 1 drink a day for nonpregnant women and 2 drinks a day for men. One drink equals 12 oz of beer, 5 oz of wine, or 1 oz of hard liquor.  Work with your health care provider to maintain a healthy body weight or to lose weight. Ask what an ideal weight is for you.  Get at least 30 minutes of exercise that causes your heart to beat faster (aerobic exercise) most days of the week. Activities may include walking, swimming, or biking.  Work with your health care provider or diet and nutrition specialist (dietitian) to adjust your eating plan to your individual calorie needs. Reading food labels  Check food labels for the amount of sodium per serving. Choose foods with less than 5 percent of the Daily Value of sodium. Generally, foods with less than 300 mg of sodium per serving fit into this eating plan.  To find whole grains, look for the word "whole" as the first word in the ingredient list. Shopping  Buy products labeled as "low-sodium" or "no salt added."  Buy fresh foods. Avoid canned foods and premade or frozen meals. Cooking  Avoid adding salt when cooking. Use salt-free seasonings or herbs instead of table salt or sea salt. Check with your health care provider or pharmacist before using salt substitutes.  Do not fry foods. Cook foods using healthy methods such as baking, boiling, grilling, and broiling instead.  Cook with  heart-healthy oils, such as olive, canola, soybean, or sunflower oil. Meal planning   Eat a balanced diet that includes: ? 5 or more servings of fruits and vegetables each day. At each meal, try to fill half of your plate with fruits and vegetables. ? Up to 6-8 servings of whole grains each day. ? Less than 6 oz of lean meat, poultry, or fish each day. A 3-oz serving of meat is about the same size as a deck of cards. One egg equals 1 oz. ? 2 servings of low-fat dairy each day. ? A serving of nuts, seeds, or beans 5 times each week. ? Heart-healthy fats. Healthy fats called Omega-3 fatty acids are found in foods such as flaxseeds and coldwater fish, like sardines, salmon, and mackerel.  Limit how much you eat of the following: ? Canned or prepackaged foods. ? Food that is high in trans fat, such as fried foods. ? Food that is high in saturated fat, such as fatty meat. ? Sweets, desserts, sugary drinks, and other foods with added sugar. ? Full-fat dairy products.  Do not salt foods before eating.  Try to eat at least 2 vegetarian meals each week.  Eat more home-cooked food and less restaurant, buffet, and fast food.  When eating at a restaurant, ask that your food be prepared with less salt or no salt, if possible. What foods are recommended? The items listed may not be a complete list. Talk with your dietitian about what   dietary choices are best for you. Grains Whole-grain or whole-wheat bread. Whole-grain or whole-wheat pasta. Brown rice. Oatmeal. Quinoa. Bulgur. Whole-grain and low-sodium cereals. Pita bread. Low-fat, low-sodium crackers. Whole-wheat flour tortillas. Vegetables Fresh or frozen vegetables (raw, steamed, roasted, or grilled). Low-sodium or reduced-sodium tomato and vegetable juice. Low-sodium or reduced-sodium tomato sauce and tomato paste. Low-sodium or reduced-sodium canned vegetables. Fruits All fresh, dried, or frozen fruit. Canned fruit in natural juice (without  added sugar). Meat and other protein foods Skinless chicken or turkey. Ground chicken or turkey. Pork with fat trimmed off. Fish and seafood. Egg whites. Dried beans, peas, or lentils. Unsalted nuts, nut butters, and seeds. Unsalted canned beans. Lean cuts of beef with fat trimmed off. Low-sodium, lean deli meat. Dairy Low-fat (1%) or fat-free (skim) milk. Fat-free, low-fat, or reduced-fat cheeses. Nonfat, low-sodium ricotta or cottage cheese. Low-fat or nonfat yogurt. Low-fat, low-sodium cheese. Fats and oils Soft margarine without trans fats. Vegetable oil. Low-fat, reduced-fat, or light mayonnaise and salad dressings (reduced-sodium). Canola, safflower, olive, soybean, and sunflower oils. Avocado. Seasoning and other foods Herbs. Spices. Seasoning mixes without salt. Unsalted popcorn and pretzels. Fat-free sweets. What foods are not recommended? The items listed may not be a complete list. Talk with your dietitian about what dietary choices are best for you. Grains Baked goods made with fat, such as croissants, muffins, or some breads. Dry pasta or rice meal packs. Vegetables Creamed or fried vegetables. Vegetables in a cheese sauce. Regular canned vegetables (not low-sodium or reduced-sodium). Regular canned tomato sauce and paste (not low-sodium or reduced-sodium). Regular tomato and vegetable juice (not low-sodium or reduced-sodium). Pickles. Olives. Fruits Canned fruit in a light or heavy syrup. Fried fruit. Fruit in cream or butter sauce. Meat and other protein foods Fatty cuts of meat. Ribs. Fried meat. Bacon. Sausage. Bologna and other processed lunch meats. Salami. Fatback. Hotdogs. Bratwurst. Salted nuts and seeds. Canned beans with added salt. Canned or smoked fish. Whole eggs or egg yolks. Chicken or turkey with skin. Dairy Whole or 2% milk, cream, and half-and-half. Whole or full-fat cream cheese. Whole-fat or sweetened yogurt. Full-fat cheese. Nondairy creamers. Whipped toppings.  Processed cheese and cheese spreads. Fats and oils Butter. Stick margarine. Lard. Shortening. Ghee. Bacon fat. Tropical oils, such as coconut, palm kernel, or palm oil. Seasoning and other foods Salted popcorn and pretzels. Onion salt, garlic salt, seasoned salt, table salt, and sea salt. Worcestershire sauce. Tartar sauce. Barbecue sauce. Teriyaki sauce. Soy sauce, including reduced-sodium. Steak sauce. Canned and packaged gravies. Fish sauce. Oyster sauce. Cocktail sauce. Horseradish that you find on the shelf. Ketchup. Mustard. Meat flavorings and tenderizers. Bouillon cubes. Hot sauce and Tabasco sauce. Premade or packaged marinades. Premade or packaged taco seasonings. Relishes. Regular salad dressings. Where to find more information:  National Heart, Lung, and Blood Institute: www.nhlbi.nih.gov  American Heart Association: www.heart.org Summary  The DASH eating plan is a healthy eating plan that has been shown to reduce high blood pressure (hypertension). It may also reduce your risk for type 2 diabetes, heart disease, and stroke.  With the DASH eating plan, you should limit salt (sodium) intake to 2,300 mg a day. If you have hypertension, you may need to reduce your sodium intake to 1,500 mg a day.  When on the DASH eating plan, aim to eat more fresh fruits and vegetables, whole grains, lean proteins, low-fat dairy, and heart-healthy fats.  Work with your health care provider or diet and nutrition specialist (dietitian) to adjust your eating plan to your individual   calorie needs. This information is not intended to replace advice given to you by your health care provider. Make sure you discuss any questions you have with your health care provider.  --------------------------------------------------------------------- Take Magnesium Citrate, Malate, or Glycinate for cramps

## 2017-09-14 NOTE — Assessment & Plan Note (Addendum)
Poor control.  Increase Lisinopril to 20 mg.  DASH diet.  She will work on weight loss

## 2017-09-14 NOTE — Progress Notes (Signed)
BP (!) 155/76 (BP Location: Left Arm, Cuff Size: Large)   Pulse 76   Temp 98.4 F (36.9 C) (Oral)   Ht 5' 5.5" (1.664 m)   Wt 193 lb 4.8 oz (87.7 kg)   LMP  (LMP Unknown)   SpO2 98%   BMI 31.68 kg/m    Subjective:    Patient ID: Allison Reynolds, female    DOB: 08-Dec-1960, 57 y.o.   MRN: [id number]  HPI: Allison Reynolds is a 57 y.o. female  Chief Complaint  Patient presents with  . Hyperlipidemia    4 week f/up  . Hypertension    4 week f/up    URI   This is a new problem. Episode onset: 2 days. The problem has been gradually worsening. Associated symptoms include rhinorrhea. Pertinent negatives include no abdominal pain, chest pain, congestion, coughing, diarrhea, dysuria, ear pain, headaches, joint pain, joint swelling, nausea, neck pain, plugged ear sensation, rash, sinus pain, sneezing, sore throat, swollen glands, vomiting or wheezing. Treatments tried: Vitamin liquid. The treatment provided no relief.   Hypertension Last visit Lisinopril was added.  Using medications without difficulty Average home BPs checks every "now and then."  Often "up and down"  No problems or lightheadedness No chest pain with exertion or shortness of breath No Edema  Cramps Pt gets intermittent cramps and wonders what she can do to prevent them.    Relevant past medical, surgical, family and social history reviewed and updated as indicated. Interim medical history since our last visit reviewed. Allergies and medications reviewed and updated.  Review of Systems  HENT: Positive for rhinorrhea. Negative for congestion, ear pain, sinus pain, sneezing and sore throat.   Respiratory: Negative for cough and wheezing.   Cardiovascular: Negative for chest pain.  Gastrointestinal: Negative for abdominal pain, diarrhea, nausea and vomiting.  Genitourinary: Negative for dysuria.  Musculoskeletal: Negative for joint pain and neck pain.  Skin: Negative for rash.  Neurological: Negative for  headaches.    Per HPI unless specifically indicated above     Objective:    BP (!) 155/76 (BP Location: Left Arm, Cuff Size: Large)   Pulse 76   Temp 98.4 F (36.9 C) (Oral)   Ht 5' 5.5" (1.664 m)   Wt 193 lb 4.8 oz (87.7 kg)   LMP  (LMP Unknown)   SpO2 98%   BMI 31.68 kg/m   Wt Readings from Last 3 Encounters:  09/14/17 193 lb 4.8 oz (87.7 kg)  08/17/17 193 lb 7 oz (87.7 kg)  06/15/17 192 lb 4.8 oz (87.2 kg)    Physical Exam  Constitutional: She is oriented to person, place, and time. She appears well-developed and well-nourished. No distress.  HENT:  Head: Normocephalic and atraumatic.  Eyes: Conjunctivae and lids are normal. Right eye exhibits no discharge. Left eye exhibits no discharge. No scleral icterus.  Neck: Normal range of motion. Neck supple. No JVD present. Carotid bruit is not present.  Cardiovascular: Normal rate, regular rhythm and normal heart sounds.  Pulmonary/Chest: Effort normal and breath sounds normal.  Abdominal: Normal appearance. There is no splenomegaly or hepatomegaly.  Musculoskeletal: Normal range of motion.  Neurological: She is alert and oriented to person, place, and time.  Skin: Skin is warm, dry and intact. No rash noted. No pallor.  Psychiatric: She has a normal mood and affect. Her behavior is normal. Judgment and thought content normal.    Results for orders placed or performed in visit on 06/15/17  Hepatitis C antibody  Result Value Ref Range   Hep C Virus Ab <0.1 0.0 - 0.9 s/co ratio  HIV antibody  Result Value Ref Range   HIV Screen 4th Generation wRfx Non Reactive Non Reactive  Lipid Panel w/o Chol/HDL Ratio  Result Value Ref Range   Cholesterol, Total 156 100 - 199 mg/dL   Triglycerides 48 0 - 149 mg/dL   HDL 71 >16 mg/dL   VLDL Cholesterol Cal 10 5 - 40 mg/dL   LDL Calculated 75 0 - 99 mg/dL  CBC with Differential/Platelet  Result Value Ref Range   WBC 5.0 3.4 - 10.8 x10E3/uL   RBC 4.84 3.77 - 5.28 x10E6/uL    Hemoglobin 13.2 11.1 - 15.9 g/dL   Hematocrit 10.9 60.4 - 46.6 %   MCV 83 79 - 97 fL   MCH 27.3 26.6 - 33.0 pg   MCHC 32.8 31.5 - 35.7 g/dL   RDW 54.0 98.1 - 19.1 %   Platelets 297 150 - 379 x10E3/uL   Neutrophils 57 Not Estab. %   Lymphs 33 Not Estab. %   Monocytes 8 Not Estab. %   Eos 1 Not Estab. %   Basos 1 Not Estab. %   Neutrophils Absolute 2.9 1.4 - 7.0 x10E3/uL   Lymphocytes Absolute 1.7 0.7 - 3.1 x10E3/uL   Monocytes Absolute 0.4 0.1 - 0.9 x10E3/uL   EOS (ABSOLUTE) 0.0 0.0 - 0.4 x10E3/uL   Basophils Absolute 0.0 0.0 - 0.2 x10E3/uL   Immature Granulocytes 0 Not Estab. %   Immature Grans (Abs) 0.0 0.0 - 0.1 x10E3/uL  Comprehensive metabolic panel  Result Value Ref Range   Glucose 98 65 - 99 mg/dL   BUN 10 6 - 24 mg/dL   Creatinine, Ser 4.78 0.57 - 1.00 mg/dL   GFR calc non Af Amer 96 >59 mL/min/1.73   GFR calc Af Amer 111 >59 mL/min/1.73   BUN/Creatinine Ratio 14 9 - 23   Sodium 139 134 - 144 mmol/L   Potassium 3.4 (L) 3.5 - 5.2 mmol/L   Chloride 95 (L) 96 - 106 mmol/L   CO2 27 20 - 29 mmol/L   Calcium 9.8 8.7 - 10.2 mg/dL   Total Protein 7.4 6.0 - 8.5 g/dL   Albumin 4.9 3.5 - 5.5 g/dL   Globulin, Total 2.5 1.5 - 4.5 g/dL   Albumin/Globulin Ratio 2.0 1.2 - 2.2   Bilirubin Total 0.6 0.0 - 1.2 mg/dL   Alkaline Phosphatase 65 39 - 117 IU/L   AST 27 0 - 40 IU/L   ALT 27 0 - 32 IU/L  TSH  Result Value Ref Range   TSH 0.758 0.450 - 4.500 uIU/mL      Assessment & Plan:   Problem List Items Addressed This Visit      Unprioritized   Hypertension    Poor control.  Increase Lisinopril to 20 mg.  DASH diet.  She will work on weight loss      Relevant Medications   lisinopril (PRINIVIL,ZESTRIL) 20 MG tablet   Muscle cramps    Written information on Magnesium       Other Visit Diagnoses    Viral upper respiratory tract infection    -  Primary   symptom control of fluids and rest.  Salt water gargles.         Follow up plan: Return in about 1 month (around  10/12/2017).

## 2017-10-19 ENCOUNTER — Ambulatory Visit: Payer: BLUE CROSS/BLUE SHIELD | Admitting: Unknown Physician Specialty

## 2017-11-05 ENCOUNTER — Ambulatory Visit
Admission: EM | Admit: 2017-11-05 | Discharge: 2017-11-05 | Disposition: A | Discharge status: Home / Self Care | Payer: BLUE CROSS/BLUE SHIELD | Attending: Family Medicine | Admitting: Family Medicine

## 2017-11-05 ENCOUNTER — Other Ambulatory Visit: Payer: Self-pay

## 2017-11-05 ENCOUNTER — Ambulatory Visit: Payer: Self-pay

## 2017-11-05 DIAGNOSIS — M25562 Pain in left knee: Secondary | ICD-10-CM | POA: Diagnosis not present

## 2017-11-05 MED ORDER — MELOXICAM 15 MG PO TABS: 15.0000 mg | ORAL_TABLET | Freq: Every day | ORAL | 0 refills | Status: DC | PRN

## 2017-11-05 NOTE — ED Triage Notes (Signed)
Patient complains of left knee pain that started 8 days ago. Patient states that she drove to [place] and back. Patient is concerned the pain could be coming from a DVT.

## 2017-11-05 NOTE — ED Provider Notes (Signed)
[facility]    CSN: [id number] Arrival date & time: 11/05/17  1424  History   Chief Complaint Chief Complaint  Patient presents with  . Knee Pain    left   HPI  57 year old female presents with left knee pain.  Patient states that she has had ongoing left knee pain recently.  She states that it started approximately 8 days ago after she took a trip to [place].  She states that she drove a great distance and is concerned that she may have a DVT.  No reports of calf swelling or pain.  She reports posterior knee pain.  Intermittent.  Tends to occur with range of motion.  No medications or interventions tried.  No known relieving factors.  No other associated symptoms.  No other complaints.  Past Medical History:  Diagnosis Date  . Arthritis   . Heart murmur   . Hypertension   . Thyroid disease     Patient Active Problem List   Diagnosis Date Noted  . Muscle cramps 09/14/2017  . PVC (premature ventricular contraction) 06/15/2017  . Hyperlipidemia 02/23/2017  . Systolic murmur 02/23/2017  . Hypertension 01/12/2017  . Encounter to establish care 01/12/2017  . Lump or mass in breast 08/24/2012    Past Surgical History:  Procedure Laterality Date  . ABDOMINAL HYSTERECTOMY  age 55  . BREAST BIOPSY Right 2016   benign  . CESAREAN SECTION  1981, 1983    OB History    Gravida  3   Para  3   Term      Preterm      AB  0   Living  2     SAB  0   TAB      Ectopic      Multiple      Live Births           Obstetric Comments  Age first menstrual period 55 Age first pregnancy 48         Home Medications    Prior to Admission medications   Medication Sig Start Date End Date Taking? Authorizing Provider  amLODipine (NORVASC) 10 MG tablet Take 1 tablet (10 mg total) by mouth daily. 08/17/17  Yes Johnson, Megan P, DO  atorvastatin (LIPITOR) 20 MG tablet Take 1 tablet (20 mg total) by mouth daily. 08/17/17  Yes Johnson, Megan P, DO    chlorthalidone (HYGROTON) 25 MG tablet Take 1 tablet (25 mg total) by mouth daily. 06/15/17  Yes Gabriel Cirri, NP  lisinopril (PRINIVIL,ZESTRIL) 20 MG tablet Take 1 tablet (20 mg total) by mouth daily. 09/14/17  Yes Gabriel Cirri, NP  meloxicam (MOBIC) 15 MG tablet Take 1 tablet (15 mg total) by mouth daily as needed. 11/05/17   Tommie Sams, DO    Family History Family History  Problem Relation Age of Onset  . Hypertension Mother   . Hypertension Father   . Heart murmur Father   . Hypertension Daughter   . Lupus Paternal Aunt     Social History Social History   Tobacco Use  . Smoking status: Current Every Day Smoker    Years: 2.00    Types: Cigarettes  . Smokeless tobacco: Never Used  . Tobacco comment: 1 pack per week  Substance Use Topics  . Alcohol use: Yes    Comment: occasionally  . Drug use: No     Allergies   Patient has no known allergies.   Review of Systems Review of Systems  Constitutional:  Negative.   Respiratory: Negative.   Musculoskeletal:       Left knee pain.   Physical Exam Triage Vital Signs ED Triage Vitals  Enc Vitals Group     BP 11/05/17 1435 (!) 160/65     Pulse Rate 11/05/17 1435 60     Resp 11/05/17 1435 16     Temp 11/05/17 1435 98 F (36.7 C)     Temp Source 11/05/17 1435 Oral     SpO2 11/05/17 1435 100 %     Weight 11/05/17 1433 180 lb (81.6 kg)     Height 11/05/17 1433 5\' 7"  (1.702 m)     Head Circumference --      Peak Flow --      Pain Score 11/05/17 1433 5     Pain Loc --      Pain Edu? --      Excl. in GC? --    Updated Vital Signs BP (!) 160/65 (BP Location: Left Arm)   Pulse 60   Temp 98 F (36.7 C) (Oral)   Resp 16   Ht 5\' 7"  (1.702 m)   Wt 180 lb (81.6 kg)   LMP  (LMP Unknown)   SpO2 100%   BMI 28.19 kg/m  Physical Exam  Constitutional: She is oriented to person, place, and time. She appears well-developed. No distress.  HENT:  Head: Normocephalic and atraumatic.  Cardiovascular: Normal rate and  regular rhythm.  Pulmonary/Chest: Effort normal. No respiratory distress.  Musculoskeletal:  Left knee -no effusion.  Ligaments intact.  No joint line tenderness.  No apparent palpable Baker's cyst.  Normal range of motion.  No calf swelling, erythema, or tenderness.  No palpable cords. Negative Homans sign.  Neurological: She is alert and oriented to person, place, and time.  Psychiatric: She has a normal mood and affect. Her behavior is normal.  Nursing note and vitals reviewed.  UC Treatments / Results  Labs (all labs ordered are listed, but only abnormal results are displayed) Labs Reviewed - No data to display  EKG None  Radiology No results found.  Procedures Procedures (including critical care time)  Medications Ordered in UC Medications - No data to display  Initial Impression / Assessment and Plan / UC Course  I have reviewed the triage vital signs and the nursing notes.  Pertinent labs & imaging results that were available during my care of the patient were reviewed by me and considered in my medical decision making (see chart for details).    57 year old female presents with knee pain.  No evidence of DVT.  Given posterior pain, it is possible that she has a Baker's cyst.  She likely has underlying osteoarthritis.  No indication for further imaging or work-up at this time.  Empiric trial of meloxicam.   Final Clinical Impressions(s) / UC Diagnoses   Final diagnoses:  Acute pain of left knee     Discharge Instructions     No evidence of clot.   You likely have a little underlying arthritis.   Rest, elevation.  Medication as needed.  Take care  Dr. Adriana Simasook    ED Prescriptions    Medication Sig Dispense Auth. Provider   meloxicam (MOBIC) 15 MG tablet Take 1 tablet (15 mg total) by mouth daily as needed. 30 tablet Tommie Samsook, Dallan Schonberg G, DO     Controlled Substance Prescriptions [place] Controlled Substance Registry consulted? Not Applicable   Tommie SamsCook, Rakayla Ricklefs G,  DO 11/05/17 1623

## 2017-11-05 NOTE — Telephone Encounter (Signed)
  Answer Assessment - Initial Assessment Questions 1. LOCATION and RADIATION: "Where is the pain located?"      *No Answer* 2. QUALITY: "What does the pain feel like?"  (e.g., sharp, dull, aching, burning)     *No Answer* 3. SEVERITY: "How bad is the pain?" "What does it keep you from doing?"   (Scale 1-10; or mild, moderate, severe)   -  MILD (1-3): doesn't interfere with normal activities    -  MODERATE (4-7): interferes with normal activities (e.g., work or school) or awakens from sleep, limping    -  SEVERE (8-10): excruciating pain, unable to do any normal activities, unable to walk     *No Answer* 4. ONSET: "When did the pain start?" "Does it come and go, or is it there all the time?"     *No Answer* 5. RECURRENT: "Have you had this pain before?" If so, ask: "When, and what happened then?" Has a h/o knee pain knee "popping"  6. SETTING: "Has there been any recent work, exercise or other activity that involved that part of the body?"      no 7. AGGRAVATING FACTORS: "What makes the knee pain worse?" (e.g., walking, climbing stairs, running)     Sitting, walking 8. ASSOCIATED SYMPTOMS: "Is there any swelling or redness of the knee?"     *No Answer* 9. OTHER SYMPTOMS: "Do you have any other symptoms?" (e.g., chest pain, difficulty breathing, fever, calf pain)  sweating more today. 10. PREGNANCY: "Is there any chance you are pregnant?" "When was your last menstrual period?"       n/a  Protocols used: KNEE PAIN-A-AH

## 2017-11-05 NOTE — Discharge Instructions (Signed)
No evidence of clot.   You likely have a little underlying arthritis.   Rest, elevation.  Medication as needed.  Take care  Dr. Adriana Simasook

## 2017-11-05 NOTE — Telephone Encounter (Signed)
Initial assessment questions: 1.  Location: behind left knee 2. Throbbing pain, intermittent rates a 3/10 3. 3/104. Pain started Sunday 8. No redness or swelling noted  Pt c/o throbbing pain behind left knee. Pt rates pain 3/10 and states it comes and goes. Denies redness or swelling. Standing or walking exacerbates the pain.  Pt's mother passed away and [place] drove [place] from [place] for gas. She states that ever since, she has had pain behind her knee. Pt stated that she has a h/o her knee "popping" but feels this pain is different than she has had in the past. Pt concerned that it is a DVT. Pt advised to go to local [facility]. No availability at PCP office.   Reason for Disposition . [1] Thigh or calf pain AND [2] only 1 side AND [3] present > 1 hour    Left knee pain  Protocols used: KNEE PAIN-A-AH

## 2018-03-08 ENCOUNTER — Encounter: Payer: Self-pay | Admitting: Nurse Practitioner

## 2018-03-08 ENCOUNTER — Ambulatory Visit (INDEPENDENT_AMBULATORY_CARE_PROVIDER_SITE_OTHER): Payer: BLUE CROSS/BLUE SHIELD | Admitting: Nurse Practitioner

## 2018-03-08 DIAGNOSIS — I10 Essential (primary) hypertension: Secondary | ICD-10-CM

## 2018-03-08 NOTE — Assessment & Plan Note (Signed)
Reports good control at home when BP readings, better than previous.  Continue Lisinopril at current dose and increase if noted elevations.  Repeat BP today 124/78.  Continue heart healthy diet at home and cessation of smoking and alcohol.

## 2018-03-08 NOTE — Progress Notes (Signed)
BP (!) 141/87 (BP Location: Left Arm, Patient Position: Sitting, Cuff Size: Large)   Pulse (!) 59   Temp 97.9 F (36.6 C)   Ht 5' 5.5" (1.664 m)   Wt 193 lb 6 oz (87.7 kg)   LMP  (LMP Unknown)   SpO2 99%   BMI 31.69 kg/m    Subjective:    Patient ID: Allison Reynolds, female    DOB: Jul 17, 1960, 57 y.o.   MRN: [id number]  HPI: Allison Reynolds is a 57 y.o. female for follow-up HTN.  Chief Complaint  Patient presents with  . Hypertension   HYPERTENSION Hypertension status: controlled  Satisfied with current treatment? yes Duration of hypertension: chronic BP monitoring frequency:  rarely BP range:  BP medication side effects:  yes Medication compliance: excellent compliance Previous BP meds: None Aspirin: no Recurrent headaches: no Visual changes: no Palpitations: no Dyspnea: no Chest pain: no Lower extremity edema: no Dizzy/lightheaded: no  Takes BP at home and it was 151/79.  Occasionally takes BP at home.  Denies headaches, CP, SOB, fatigue, or change in activity.  Takes medication at home without ADR.  No edema.  States she has been told to try DASH diet, but has not "really been following".  Reports she is attempting weight loss and is no longer smoking or drinking alcohol, which she reports has helped her blood pressure.   Relevant past medical, surgical, family and social history reviewed and updated as indicated. Interim medical history since our last visit reviewed. Allergies and medications reviewed and updated.  Review of Systems  Constitutional: Negative for activity change, appetite change and fatigue.  Respiratory: Negative for cough, chest tightness and shortness of breath.   Cardiovascular: Negative for chest pain, palpitations and leg swelling.  Gastrointestinal: Negative for abdominal distention and abdominal pain.  Neurological: Negative for dizziness, numbness and headaches.    Per HPI unless specifically indicated above     Objective:      BP (!) 141/87 (BP Location: Left Arm, Patient Position: Sitting, Cuff Size: Large)   Pulse (!) 59   Temp 97.9 F (36.6 C)   Ht 5' 5.5" (1.664 m)   Wt 193 lb 6 oz (87.7 kg)   LMP  (LMP Unknown)   SpO2 99%   BMI 31.69 kg/m   Wt Readings from Last 3 Encounters:  03/08/18 193 lb 6 oz (87.7 kg)  11/05/17 180 lb (81.6 kg)  09/14/17 193 lb 4.8 oz (87.7 kg)    Physical Exam  Constitutional: She is oriented to person, place, and time. She appears well-developed and well-nourished.  HENT:  Head: Normocephalic and atraumatic.  Eyes: Pupils are equal, round, and reactive to light.  Cardiovascular: Normal rate and regular rhythm.  Murmur heard. Grade 1 systolic murmur noted.  Regular rate.  Pulmonary/Chest: Effort normal and breath sounds normal.  Abdominal: Soft. Bowel sounds are normal.  Musculoskeletal: She exhibits no edema.  Neurological: She is alert and oriented to person, place, and time.  Skin: Skin is warm and dry.  Psychiatric: She has a normal mood and affect. Her behavior is normal.  Repeat BP 124/78 on left arm, sitting.  No edema BLE.  Results for orders placed or performed in visit on 06/15/17  Hepatitis C antibody  Result Value Ref Range   Hep C Virus Ab <0.1 0.0 - 0.9 s/co ratio  HIV antibody  Result Value Ref Range   HIV Screen 4th Generation wRfx Non Reactive Non Reactive  Lipid Panel w/o Chol/HDL Ratio  Result Value Ref Range   Cholesterol, Total 156 100 - 199 mg/dL   Triglycerides 48 0 - 149 mg/dL   HDL 71 >16 mg/dL   VLDL Cholesterol Cal 10 5 - 40 mg/dL   LDL Calculated 75 0 - 99 mg/dL  CBC with Differential/Platelet  Result Value Ref Range   WBC 5.0 3.4 - 10.8 x10E3/uL   RBC 4.84 3.77 - 5.28 x10E6/uL   Hemoglobin 13.2 11.1 - 15.9 g/dL   Hematocrit 10.9 60.4 - 46.6 %   MCV 83 79 - 97 fL   MCH 27.3 26.6 - 33.0 pg   MCHC 32.8 31.5 - 35.7 g/dL   RDW 54.0 98.1 - 19.1 %   Platelets 297 150 - 379 x10E3/uL   Neutrophils 57 Not Estab. %   Lymphs 33 Not  Estab. %   Monocytes 8 Not Estab. %   Eos 1 Not Estab. %   Basos 1 Not Estab. %   Neutrophils Absolute 2.9 1.4 - 7.0 x10E3/uL   Lymphocytes Absolute 1.7 0.7 - 3.1 x10E3/uL   Monocytes Absolute 0.4 0.1 - 0.9 x10E3/uL   EOS (ABSOLUTE) 0.0 0.0 - 0.4 x10E3/uL   Basophils Absolute 0.0 0.0 - 0.2 x10E3/uL   Immature Granulocytes 0 Not Estab. %   Immature Grans (Abs) 0.0 0.0 - 0.1 x10E3/uL  Comprehensive metabolic panel  Result Value Ref Range   Glucose 98 65 - 99 mg/dL   BUN 10 6 - 24 mg/dL   Creatinine, Ser 4.78 0.57 - 1.00 mg/dL   GFR calc non Af Amer 96 >59 mL/min/1.73   GFR calc Af Amer 111 >59 mL/min/1.73   BUN/Creatinine Ratio 14 9 - 23   Sodium 139 134 - 144 mmol/L   Potassium 3.4 (L) 3.5 - 5.2 mmol/L   Chloride 95 (L) 96 - 106 mmol/L   CO2 27 20 - 29 mmol/L   Calcium 9.8 8.7 - 10.2 mg/dL   Total Protein 7.4 6.0 - 8.5 g/dL   Albumin 4.9 3.5 - 5.5 g/dL   Globulin, Total 2.5 1.5 - 4.5 g/dL   Albumin/Globulin Ratio 2.0 1.2 - 2.2   Bilirubin Total 0.6 0.0 - 1.2 mg/dL   Alkaline Phosphatase 65 39 - 117 IU/L   AST 27 0 - 40 IU/L   ALT 27 0 - 32 IU/L  TSH  Result Value Ref Range   TSH 0.758 0.450 - 4.500 uIU/mL      Assessment & Plan:   Problem List Items Addressed This Visit      Cardiovascular and Mediastinum   Hypertension    Reports good control at home when BP readings, better than previous.  Continue Lisinopril at current dose and increase if noted elevations.  Repeat BP today 124/78.  Continue heart healthy diet at home and cessation of smoking and alcohol.          Follow up plan: Return in about 3 months (around 06/08/2018), or if symptoms worsen or fail to improve, for physical.

## 2018-03-08 NOTE — Patient Instructions (Signed)

## 2018-05-01 ENCOUNTER — Other Ambulatory Visit: Payer: Self-pay | Admitting: Unknown Physician Specialty

## 2018-05-02 NOTE — Telephone Encounter (Signed)
Requested medication (s) are due for refill today: yes  Requested medication (s) are on the active medication list: yes  Last refill:  06/05/17 for 90  Tabs and 1 refill  Future visit scheduled: yes  Notes to clinic:  Cardiovascular diuretics - thiazide failed per protocol  Requested Prescriptions  Pending Prescriptions Disp Refills   chlorthalidone (HYGROTON) 25 MG tablet [Pharmacy Med Name: CHLORTHALIDONE 25MG     TAB] 90 tablet 1    Sig: TAKE 1 TABLET BY MOUTH ONCE DAILY     Cardiovascular: Diuretics - Thiazide Failed - 05/01/2018  6:40 AM      Failed - K in normal range and within 360 days    Potassium  Date Value Ref Range Status  06/15/2017 3.4 (L) 3.5 - 5.2 mmol/L Final         Failed - Last BP in normal range    BP Readings from Last 1 Encounters:  03/08/18 (!) 141/87         Passed - Ca in normal range and within 360 days    Calcium  Date Value Ref Range Status  06/15/2017 9.8 8.7 - 10.2 mg/dL Final         Passed - Cr in normal range and within 360 days    Creatinine, Ser  Date Value Ref Range Status  06/15/2017 0.69 0.57 - 1.00 mg/dL Final         Passed - Na in normal range and within 360 days    Sodium  Date Value Ref Range Status  06/15/2017 139 134 - 144 mmol/L Final         Passed - Valid encounter within last 6 months    Recent Outpatient Visits          1 month ago Essential hypertension   [facility] [place], LowellJolene T, NP   7 months ago Viral upper respiratory tract infection   [facility] [place] CirriWicker, [name], NP   8 months ago [facility] P[facility]   10 months ago Palpitations   [facility] CirriWicker, [name], NP   1 year ago Colon cancer screening   [facility] CirriWicker, [name], NP      Future Appointments            In 1 month Cannady[facility] Dorie RankJolene T, [facility]

## 2018-06-02 ENCOUNTER — Other Ambulatory Visit: Payer: Self-pay | Admitting: Family Medicine

## 2018-06-02 NOTE — Telephone Encounter (Signed)
Requested Prescriptions  Pending Prescriptions Disp Refills  . atorvastatin (LIPITOR) 20 MG tablet [Pharmacy Med Name: Atorvastatin Calcium 20 MG Oral Tablet] 90 tablet 0    Sig: TAKE 1 TABLET BY MOUTH ONCE DAILY     Cardiovascular:  Antilipid - Statins Passed - 06/02/2018  6:47 AM      Passed - Total Cholesterol in normal range and within 360 days    Cholesterol, Total  Date Value Ref Range Status  06/15/2017 156 100 - 199 mg/dL Final         Passed - LDL in normal range and within 360 days    LDL Calculated  Date Value Ref Range Status  06/15/2017 75 0 - 99 mg/dL Final         Passed - HDL in normal range and within 360 days    HDL  Date Value Ref Range Status  06/15/2017 71 >39 mg/dL Final         Passed - Triglycerides in normal range and within 360 days    Triglycerides  Date Value Ref Range Status  06/15/2017 48 0 - 149 mg/dL Final         Passed - Patient is not pregnant      Passed - Valid encounter within last 12 months    Recent Outpatient Visits          2 months ago Essential hypertension   [facility] Churchill, Walker T, NP   8 months ago Viral upper respiratory tract infection   [facility] Gabriel Cirri, NP   9 months ago Essential hypertension   [facility], [place], DO   11 months ago Palpitations   [facility] Gabriel Cirri, NP   1 year ago Colon cancer screening   [facility] Gabriel Cirri, NP      Future Appointments            In 1 week Cannady, Dorie Rank, NP [facility]

## 2018-06-09 ENCOUNTER — Ambulatory Visit: Payer: BLUE CROSS/BLUE SHIELD | Admitting: Nurse Practitioner

## 2018-06-28 ENCOUNTER — Ambulatory Visit: Payer: BLUE CROSS/BLUE SHIELD | Admitting: Nurse Practitioner

## 2018-06-28 ENCOUNTER — Encounter: Payer: Self-pay | Admitting: Nurse Practitioner

## 2018-06-28 VITALS — BP 142/80 | HR 72 | Temp 97.9°F | Ht 65.5 in | Wt 193.4 lb

## 2018-06-28 DIAGNOSIS — Z1231 Encounter for screening mammogram for malignant neoplasm of breast: Secondary | ICD-10-CM | POA: Diagnosis not present

## 2018-06-28 DIAGNOSIS — I10 Essential (primary) hypertension: Secondary | ICD-10-CM

## 2018-06-28 MED ORDER — LISINOPRIL 30 MG PO TABS: 30.0000 mg | ORAL_TABLET | Freq: Every day | ORAL | 3 refills | Status: DC

## 2018-06-28 MED ORDER — AMLODIPINE BESYLATE 10 MG PO TABS: 10.0000 mg | ORAL_TABLET | Freq: Every day | ORAL | 1 refills | Status: DC

## 2018-06-28 NOTE — Progress Notes (Signed)
BP (!) 142/80 (BP Location: Left Arm, Patient Position: Sitting)   Pulse 72   Temp 97.9 F (36.6 C)   Ht 5' 5.5" (1.664 m)   Wt 193 lb 7 oz (87.7 kg)   LMP  (LMP Unknown)   SpO2 100%   BMI 31.70 kg/m    Subjective:    Patient ID: Allison Reynolds, female    DOB: 1960/09/01, 58 y.o.   MRN: [id number]  HPI: Allison DaysCassandra Stengel is a 58 y.o. female presents for HTN f/u  Chief Complaint  Patient presents with  . Hypertension   HYPERTENSION Continues on Lisinopril, Hygroton, and Amlodipine.  Endorses weight gain and wishes to focus on diet and exercise. Hypertension status: stable  Satisfied with current treatment? yes Duration of hypertension: chronic BP monitoring frequency:  rarely BP range: 140-150/70-80 BP medication side effects:  no Medication compliance: good compliance Aspirin: no Recurrent headaches: no Visual changes: no Palpitations: no Dyspnea: no Chest pain: no Lower extremity edema: no Dizzy/lightheaded: no  Relevant past medical, surgical, family and social history reviewed and updated as indicated. Interim medical history since our last visit reviewed. Allergies and medications reviewed and updated.  Review of Systems  Constitutional: Negative for activity change, appetite change, diaphoresis, fatigue and fever.  Respiratory: Negative for cough, chest tightness and shortness of breath.   Cardiovascular: Negative for chest pain, palpitations and leg swelling.  Gastrointestinal: Negative for abdominal distention, abdominal pain, constipation, diarrhea, nausea and vomiting.  Endocrine: Negative for cold intolerance, heat intolerance, polydipsia, polyphagia and polyuria.  Neurological: Negative for dizziness, syncope, weakness, light-headedness, numbness and headaches.  Psychiatric/Behavioral: Negative.     Per HPI unless specifically indicated above     Objective:    BP (!) 142/80 (BP Location: Left Arm, Patient Position: Sitting)   Pulse 72   Temp  97.9 F (36.6 C)   Ht 5' 5.5" (1.664 m)   Wt 193 lb 7 oz (87.7 kg)   LMP  (LMP Unknown)   SpO2 100%   BMI 31.70 kg/m   Wt Readings from Last 3 Encounters:  06/28/18 193 lb 7 oz (87.7 kg)  03/08/18 193 lb 6 oz (87.7 kg)  11/05/17 180 lb (81.6 kg)    Physical Exam Vitals signs and nursing note reviewed.  Constitutional:      General: She is awake.     Appearance: She is well-developed.  HENT:     Head: Normocephalic.     Right Ear: Hearing normal.     Left Ear: Hearing normal.     Nose: Nose normal.     Mouth/Throat:     Mouth: Mucous membranes are moist.  Eyes:     General: Lids are normal.        Right eye: No discharge.        Left eye: No discharge.     Conjunctiva/sclera: Conjunctivae normal.     Pupils: Pupils are equal, round, and reactive to light.  Neck:     Musculoskeletal: Normal range of motion and neck supple.     Thyroid: No thyromegaly.     Vascular: No carotid bruit or JVD.  Cardiovascular:     Rate and Rhythm: Normal rate and regular rhythm.     Heart sounds: Murmur present. Systolic murmur present with a grade of 2/6. No gallop.   Pulmonary:     Effort: Pulmonary effort is normal.     Breath sounds: Normal breath sounds.  Abdominal:     General: Bowel sounds are normal.  Palpations: Abdomen is soft. There is no hepatomegaly or splenomegaly.  Musculoskeletal:     Right lower leg: No edema.     Left lower leg: No edema.  Lymphadenopathy:     Cervical: No cervical adenopathy.  Skin:    General: Skin is warm and dry.  Neurological:     Mental Status: She is alert and oriented to person, place, and time.  Psychiatric:        Attention and Perception: Attention normal.        Mood and Affect: Mood normal.        Behavior: Behavior normal. Behavior is cooperative.        Thought Content: Thought content normal.        Judgment: Judgment normal.     Results for orders placed or performed in visit on 06/15/17  Hepatitis C antibody  Result  Value Ref Range   Hep C Virus Ab <0.1 0.0 - 0.9 s/co ratio  HIV antibody  Result Value Ref Range   HIV Screen 4th Generation wRfx Non Reactive Non Reactive  Lipid Panel w/o Chol/HDL Ratio  Result Value Ref Range   Cholesterol, Total 156 100 - 199 mg/dL   Triglycerides 48 0 - 149 mg/dL   HDL 71 >69 mg/dL   VLDL Cholesterol Cal 10 5 - 40 mg/dL   LDL Calculated 75 0 - 99 mg/dL  CBC with Differential/Platelet  Result Value Ref Range   WBC 5.0 3.4 - 10.8 x10E3/uL   RBC 4.84 3.77 - 5.28 x10E6/uL   Hemoglobin 13.2 11.1 - 15.9 g/dL   Hematocrit 62.9 52.8 - 46.6 %   MCV 83 79 - 97 fL   MCH 27.3 26.6 - 33.0 pg   MCHC 32.8 31.5 - 35.7 g/dL   RDW 41.3 24.4 - 01.0 %   Platelets 297 150 - 379 x10E3/uL   Neutrophils 57 Not Estab. %   Lymphs 33 Not Estab. %   Monocytes 8 Not Estab. %   Eos 1 Not Estab. %   Basos 1 Not Estab. %   Neutrophils Absolute 2.9 1.4 - 7.0 x10E3/uL   Lymphocytes Absolute 1.7 0.7 - 3.1 x10E3/uL   Monocytes Absolute 0.4 0.1 - 0.9 x10E3/uL   EOS (ABSOLUTE) 0.0 0.0 - 0.4 x10E3/uL   Basophils Absolute 0.0 0.0 - 0.2 x10E3/uL   Immature Granulocytes 0 Not Estab. %   Immature Grans (Abs) 0.0 0.0 - 0.1 x10E3/uL  Comprehensive metabolic panel  Result Value Ref Range   Glucose 98 65 - 99 mg/dL   BUN 10 6 - 24 mg/dL   Creatinine, Ser 2.72 0.57 - 1.00 mg/dL   GFR calc non Af Amer 96 >59 mL/min/1.73   GFR calc Af Amer 111 >59 mL/min/1.73   BUN/Creatinine Ratio 14 9 - 23   Sodium 139 134 - 144 mmol/L   Potassium 3.4 (L) 3.5 - 5.2 mmol/L   Chloride 95 (L) 96 - 106 mmol/L   CO2 27 20 - 29 mmol/L   Calcium 9.8 8.7 - 10.2 mg/dL   Total Protein 7.4 6.0 - 8.5 g/dL   Albumin 4.9 3.5 - 5.5 g/dL   Globulin, Total 2.5 1.5 - 4.5 g/dL   Albumin/Globulin Ratio 2.0 1.2 - 2.2   Bilirubin Total 0.6 0.0 - 1.2 mg/dL   Alkaline Phosphatase 65 39 - 117 IU/L   AST 27 0 - 40 IU/L   ALT 27 0 - 32 IU/L  TSH  Result Value Ref Range   TSH 0.758 0.450 -  4.500 uIU/mL      Assessment & Plan:    Problem List Items Addressed This Visit      Cardiovascular and Mediastinum   Hypertension - Primary    Chronic, ongoing.  Increase Lisinopril to 30 MG daily as not at goal.  Continue Amlodopine and Chlorthalidone.  Labs next visit.      Relevant Medications   lisinopril (PRINIVIL,ZESTRIL) 30 MG tablet   amLODipine (NORVASC) 10 MG tablet    Other Visit Diagnoses    Screening mammogram, encounter for       Relevant Orders   MM DIGITAL SCREENING BILATERAL       Follow up plan: Return in about 4 weeks (around 07/26/2018) for Physical exam.

## 2018-06-28 NOTE — Patient Instructions (Signed)
DASH Eating Plan  DASH stands for "Dietary Approaches to Stop Hypertension." The DASH eating plan is a healthy eating plan that has been shown to reduce high blood pressure (hypertension). It may also reduce your risk for type 2 diabetes, heart disease, and stroke. The DASH eating plan may also help with weight loss.  What are tips for following this plan?    General guidelines   Avoid eating more than 2,300 mg (milligrams) of salt (sodium) a day. If you have hypertension, you may need to reduce your sodium intake to 1,500 mg a day.   Limit alcohol intake to no more than 1 drink a day for nonpregnant women and 2 drinks a day for men. One drink equals 12 oz of beer, 5 oz of wine, or 1 oz of hard liquor.   Work with your health care provider to maintain a healthy body weight or to lose weight. Ask what an ideal weight is for you.   Get at least 30 minutes of exercise that causes your heart to beat faster (aerobic exercise) most days of the week. Activities may include walking, swimming, or biking.   Work with your health care provider or diet and nutrition specialist (dietitian) to adjust your eating plan to your individual calorie needs.  Reading food labels     Check food labels for the amount of sodium per serving. Choose foods with less than 5 percent of the Daily Value of sodium. Generally, foods with less than 300 mg of sodium per serving fit into this eating plan.   To find whole grains, look for the word "whole" as the first word in the ingredient list.  Shopping   Buy products labeled as "low-sodium" or "no salt added."   Buy fresh foods. Avoid canned foods and premade or frozen meals.  Cooking   Avoid adding salt when cooking. Use salt-free seasonings or herbs instead of table salt or sea salt. Check with your health care provider or pharmacist before using salt substitutes.   Do not fry foods. Cook foods using healthy methods such as baking, boiling, grilling, and broiling instead.   Cook with  heart-healthy oils, such as olive, canola, soybean, or sunflower oil.  Meal planning   Eat a balanced diet that includes:  ? 5 or more servings of fruits and vegetables each day. At each meal, try to fill half of your plate with fruits and vegetables.  ? Up to 6-8 servings of whole grains each day.  ? Less than 6 oz of lean meat, poultry, or fish each day. A 3-oz serving of meat is about the same size as a deck of cards. One egg equals 1 oz.  ? 2 servings of low-fat dairy each day.  ? A serving of nuts, seeds, or beans 5 times each week.  ? Heart-healthy fats. Healthy fats called Omega-3 fatty acids are found in foods such as flaxseeds and coldwater fish, like sardines, salmon, and mackerel.   Limit how much you eat of the following:  ? Canned or prepackaged foods.  ? Food that is high in trans fat, such as fried foods.  ? Food that is high in saturated fat, such as fatty meat.  ? Sweets, desserts, sugary drinks, and other foods with added sugar.  ? Full-fat dairy products.   Do not salt foods before eating.   Try to eat at least 2 vegetarian meals each week.   Eat more home-cooked food and less restaurant, buffet, and fast food.     When eating at a restaurant, ask that your food be prepared with less salt or no salt, if possible.  What foods are recommended?  The items listed may not be a complete list. Talk with your dietitian about what dietary choices are best for you.  Grains  Whole-grain or whole-wheat bread. Whole-grain or whole-wheat pasta. Brown rice. Oatmeal. Quinoa. Bulgur. Whole-grain and low-sodium cereals. Pita bread. Low-fat, low-sodium crackers. Whole-wheat flour tortillas.  Vegetables  Fresh or frozen vegetables (raw, steamed, roasted, or grilled). Low-sodium or reduced-sodium tomato and vegetable juice. Low-sodium or reduced-sodium tomato sauce and tomato paste. Low-sodium or reduced-sodium canned vegetables.  Fruits  All fresh, dried, or frozen fruit. Canned fruit in natural juice (without  added sugar).  Meat and other protein foods  Skinless chicken or turkey. Ground chicken or turkey. Pork with fat trimmed off. Fish and seafood. Egg whites. Dried beans, peas, or lentils. Unsalted nuts, nut butters, and seeds. Unsalted canned beans. Lean cuts of beef with fat trimmed off. Low-sodium, lean deli meat.  Dairy  Low-fat (1%) or fat-free (skim) milk. Fat-free, low-fat, or reduced-fat cheeses. Nonfat, low-sodium ricotta or cottage cheese. Low-fat or nonfat yogurt. Low-fat, low-sodium cheese.  Fats and oils  Soft margarine without trans fats. Vegetable oil. Low-fat, reduced-fat, or light mayonnaise and salad dressings (reduced-sodium). Canola, safflower, olive, soybean, and sunflower oils. Avocado.  Seasoning and other foods  Herbs. Spices. Seasoning mixes without salt. Unsalted popcorn and pretzels. Fat-free sweets.  What foods are not recommended?  The items listed may not be a complete list. Talk with your dietitian about what dietary choices are best for you.  Grains  Baked goods made with fat, such as croissants, muffins, or some breads. Dry pasta or rice meal packs.  Vegetables  Creamed or fried vegetables. Vegetables in a cheese sauce. Regular canned vegetables (not low-sodium or reduced-sodium). Regular canned tomato sauce and paste (not low-sodium or reduced-sodium). Regular tomato and vegetable juice (not low-sodium or reduced-sodium). Pickles. Olives.  Fruits  Canned fruit in a light or heavy syrup. Fried fruit. Fruit in cream or butter sauce.  Meat and other protein foods  Fatty cuts of meat. Ribs. Fried meat. Bacon. Sausage. Bologna and other processed lunch meats. Salami. Fatback. Hotdogs. Bratwurst. Salted nuts and seeds. Canned beans with added salt. Canned or smoked fish. Whole eggs or egg yolks. Chicken or turkey with skin.  Dairy  Whole or 2% milk, cream, and half-and-half. Whole or full-fat cream cheese. Whole-fat or sweetened yogurt. Full-fat cheese. Nondairy creamers. Whipped toppings.  Processed cheese and cheese spreads.  Fats and oils  Butter. Stick margarine. Lard. Shortening. Ghee. Bacon fat. Tropical oils, such as coconut, palm kernel, or palm oil.  Seasoning and other foods  Salted popcorn and pretzels. Onion salt, garlic salt, seasoned salt, table salt, and sea salt. Worcestershire sauce. Tartar sauce. Barbecue sauce. Teriyaki sauce. Soy sauce, including reduced-sodium. Steak sauce. Canned and packaged gravies. Fish sauce. Oyster sauce. Cocktail sauce. Horseradish that you find on the shelf. Ketchup. Mustard. Meat flavorings and tenderizers. Bouillon cubes. Hot sauce and Tabasco sauce. Premade or packaged marinades. Premade or packaged taco seasonings. Relishes. Regular salad dressings.  Where to find more information:   National Heart, Lung, and Blood Institute: www.nhlbi.nih.gov   American Heart Association: www.heart.org  Summary   The DASH eating plan is a healthy eating plan that has been shown to reduce high blood pressure (hypertension). It may also reduce your risk for type 2 diabetes, heart disease, and stroke.   With the   DASH eating plan, you should limit salt (sodium) intake to 2,300 mg a day. If you have hypertension, you may need to reduce your sodium intake to 1,500 mg a day.   When on the DASH eating plan, aim to eat more fresh fruits and vegetables, whole grains, lean proteins, low-fat dairy, and heart-healthy fats.   Work with your health care provider or diet and nutrition specialist (dietitian) to adjust your eating plan to your individual calorie needs.  This information is not intended to replace advice given to you by your health care provider. Make sure you discuss any questions you have with your health care provider.  Document Released: 05/07/2011 Document Revised: 05/11/2016 Document Reviewed: 05/11/2016  Elsevier Interactive Patient Education  2019 [employer]

## 2018-06-28 NOTE — Assessment & Plan Note (Signed)
Chronic, ongoing.  Increase Lisinopril to 30 MG daily as not at goal.  Continue Amlodopine and Chlorthalidone.  Labs next visit.

## 2018-07-26 ENCOUNTER — Encounter: Payer: BLUE CROSS/BLUE SHIELD | Admitting: Nurse Practitioner

## 2018-08-24 ENCOUNTER — Encounter: Payer: BLUE CROSS/BLUE SHIELD | Admitting: Nurse Practitioner

## 2018-10-03 ENCOUNTER — Telehealth: Payer: Self-pay | Admitting: Nurse Practitioner

## 2018-10-03 NOTE — Telephone Encounter (Signed)
Called pt to set up virtual visit, no answer left voicemail.

## 2018-10-04 ENCOUNTER — Encounter: Payer: BLUE CROSS/BLUE SHIELD | Admitting: Nurse Practitioner

## 2018-11-05 ENCOUNTER — Other Ambulatory Visit: Payer: Self-pay | Admitting: Family Medicine

## 2018-11-05 NOTE — Telephone Encounter (Signed)
Requested Prescriptions  Pending Prescriptions Disp Refills  . atorvastatin (LIPITOR) 20 MG tablet [Pharmacy Med Name: Atorvastatin Calcium 20 MG Oral Tablet] 90 tablet 2    Sig: Take 1 tablet by mouth once daily     Cardiovascular:  Antilipid - Statins Failed - 11/05/2018  4:21 PM      Failed - Total Cholesterol in normal range and within 360 days    Cholesterol, Total  Date Value Ref Range Status  06/15/2017 156 100 - 199 mg/dL Final         Failed - LDL in normal range and within 360 days    LDL Calculated  Date Value Ref Range Status  06/15/2017 75 0 - 99 mg/dL Final         Failed - HDL in normal range and within 360 days    HDL  Date Value Ref Range Status  06/15/2017 71 >39 mg/dL Final         Failed - Triglycerides in normal range and within 360 days    Triglycerides  Date Value Ref Range Status  06/15/2017 48 0 - 149 mg/dL Final         Passed - Patient is not pregnant      Passed - Valid encounter within last 12 months    Recent Outpatient Visits          4 months ago Essential hypertension   [place], Barbaraann Faster, NP   8 months ago Essential hypertension   [place], Henrine Screws T, NP   1 year ago Viral upper respiratory tract infection   [facility] Kathrine Haddock, NP   1 year ago Essential hypertension   [place], [place], DO   1 year ago Palpitations   [facility] Kathrine Haddock, NP

## 2018-12-12 ENCOUNTER — Other Ambulatory Visit: Payer: Self-pay

## 2018-12-12 ENCOUNTER — Ambulatory Visit (LOCAL_COMMUNITY_HEALTH_CENTER): Payer: Self-pay

## 2018-12-12 DIAGNOSIS — Z111 Encounter for screening for respiratory tuberculosis: Secondary | ICD-10-CM

## 2018-12-12 MED ORDER — TUBERCULIN PPD 5 UNIT/0.1ML ID SOLN: 0.1000 mL | Freq: Once | INTRADERMAL | 0 refills | Status: DC

## 2018-12-15 ENCOUNTER — Other Ambulatory Visit: Payer: Self-pay

## 2018-12-15 ENCOUNTER — Ambulatory Visit (LOCAL_COMMUNITY_HEALTH_CENTER): Payer: Self-pay

## 2018-12-15 DIAGNOSIS — Z111 Encounter for screening for respiratory tuberculosis: Secondary | ICD-10-CM | POA: Insufficient documentation

## 2018-12-15 LAB — TB SKIN TEST
Induration: 0 mm
TB Skin Test: NEGATIVE

## 2018-12-15 NOTE — Addendum Note (Signed)
Addended by: Doy Mince on: 12/15/2018 10:49 AM   Modules accepted: Level of Service

## 2018-12-15 NOTE — Progress Notes (Signed)
PPDR on 12/15/2018 Negative 0 mm. 

## 2019-02-07 ENCOUNTER — Other Ambulatory Visit: Payer: Self-pay | Admitting: Nurse Practitioner

## 2019-02-07 NOTE — Telephone Encounter (Signed)
Called patient. Left VM for patient to return call to the office. 

## 2019-02-07 NOTE — Telephone Encounter (Signed)
Requested medication (s) are due for refill today: yes  Requested medication (s) are on the active medication list:yes   Last refill: 09/15/2018  Future visit scheduled: no  Notes to clinic:  Review for refill   Requested Prescriptions  Pending Prescriptions Disp Refills   chlorthalidone (HYGROTON) 25 MG tablet [Pharmacy Med Name: Chlorthalidone 25 MG Oral Tablet] 90 tablet 0    Sig: TAKE 1 TABLET BY MOUTH ONCE DAILY     Cardiovascular: Diuretics - Thiazide Failed - 02/07/2019  6:08 AM      Failed - Ca in normal range and within 360 days    Calcium  Date Value Ref Range Status  06/15/2017 9.8 8.7 - 10.2 mg/dL Final         Failed - Cr in normal range and within 360 days    Creatinine, Ser  Date Value Ref Range Status  06/15/2017 0.69 0.57 - 1.00 mg/dL Final         Failed - K in normal range and within 360 days    Potassium  Date Value Ref Range Status  06/15/2017 3.4 (L) 3.5 - 5.2 mmol/L Final         Failed - Na in normal range and within 360 days    Sodium  Date Value Ref Range Status  06/15/2017 139 134 - 144 mmol/L Final         Failed - Last BP in normal range    BP Readings from Last 1 Encounters:  06/28/18 (!) 142/80         Failed - Valid encounter within last 6 months    Recent Outpatient Visits          7 months ago Essential hypertension   [place], Barbaraann Faster, NP   11 months ago Essential hypertension   [place], Henrine Screws T, NP   1 year ago Viral upper respiratory tract infection   [facility] Kathrine Haddock, NP   1 year ago Essential hypertension   [place], R[place], DO   1 year ago Palpitations   [facility] Kathrine Haddock, NP

## 2019-02-07 NOTE — Telephone Encounter (Signed)
Needs follow-up visit scheduled for upcoming months, will provide courtesy refill only.

## 2019-02-07 NOTE — Telephone Encounter (Signed)
Routing to provider  

## 2019-02-08 NOTE — Telephone Encounter (Signed)
Called patient. No answer. Left VM for patient to return call. 

## 2019-02-10 ENCOUNTER — Encounter: Payer: Self-pay | Admitting: Nurse Practitioner

## 2019-02-10 ENCOUNTER — Other Ambulatory Visit: Payer: Self-pay

## 2019-02-10 ENCOUNTER — Ambulatory Visit (INDEPENDENT_AMBULATORY_CARE_PROVIDER_SITE_OTHER): Payer: BLUE CROSS/BLUE SHIELD | Admitting: Nurse Practitioner

## 2019-02-10 VITALS — BP 153/79 | HR 76

## 2019-02-10 DIAGNOSIS — E785 Hyperlipidemia, unspecified: Secondary | ICD-10-CM

## 2019-02-10 DIAGNOSIS — I10 Essential (primary) hypertension: Secondary | ICD-10-CM | POA: Diagnosis not present

## 2019-02-10 MED ORDER — LISINOPRIL 40 MG PO TABS: 40.0000 mg | ORAL_TABLET | Freq: Every day | ORAL | 3 refills | Status: DC

## 2019-02-10 NOTE — Progress Notes (Signed)
BP (!) 153/79 (BP Location: Left Arm, Patient Position: Sitting)   Pulse 76   LMP  (LMP Unknown)    Subjective:    Patient ID: Allison Reynolds, female    DOB: July 10, 1960, 58 y.o.   MRN: [id number]  HPI: Allison Reynolds is a 58 y.o. female  Chief Complaint  Patient presents with  . Hyperlipidemia  . Hypertension    . This visit was completed via telephone due to the restrictions of the COVID-19 pandemic. All issues as above were discussed and addressed but no physical exam was performed. If it was felt that the patient should be evaluated in the office, they were directed there. The patient verbally consented to this visit. Patient was unable to complete an audio/visual visit due to Technical difficulties,Lack of internet. Due to the catastrophic nature of the COVID-19 pandemic, this visit was done through audio contact only. . Location of the patient: home . Location of the provider: home . Those involved with this call:  . Provider: Aura DialsJolene Marvin Grabill, DNP . CMA: Wilhemena DurieBrittany Russell, CMA . Front Desk/Registration: Harriet PhoJoliza Johnson  . Time spent on call: 15 minutes on the phone discussing health concerns. 10 minutes total spent in review of patient's record and preparation of their chart.  . I verified patient identity using two factors (patient name and date of birth). Patient consents verbally to being seen via telemedicine visit today.    HYPERTENSION / HYPERLIPIDEMIA Her Lisinopril was increased to 30 MG in January. Missed follow-up at 4 weeks and labs, K+ was 3.4 and she missed recheck.  Also takes Hygroton 25 MG + Amlodipine 10 MG daily.  Lipitor 20 MG for HLD.  She states she has been working out, walking in the park, 4 miles in the park.  Does endorse eating two bags of Doritos last night and real hot potato chips.   Satisfied with current treatment? yes Duration of hypertension: chronic BP monitoring frequency: daily BP range: 140-150/70-80 BP medication side effects: no  Duration of hyperlipidemia: chronic Cholesterol medication side effects: no Cholesterol supplements: none Medication compliance: good compliance Aspirin: no Recent stressors: no Recurrent headaches: no Visual changes: no Palpitations: no Dyspnea: no Chest pain: no Lower extremity edema: no Dizzy/lightheaded: no  Relevant past medical, surgical, family and social history reviewed and updated as indicated. Interim medical history since our last visit reviewed. Allergies and medications reviewed and updated.  Review of Systems  Constitutional: Negative for activity change, appetite change, diaphoresis, fatigue and fever.  Respiratory: Negative for cough, chest tightness and shortness of breath.   Cardiovascular: Negative for chest pain, palpitations and leg swelling.  Gastrointestinal: Negative for abdominal distention, abdominal pain, constipation, diarrhea, nausea and vomiting.  Neurological: Negative for dizziness, syncope, weakness, light-headedness, numbness and headaches.  Psychiatric/Behavioral: Negative.     Per HPI unless specifically indicated above     Objective:    BP (!) 153/79 (BP Location: Left Arm, Patient Position: Sitting)   Pulse 76   LMP  (LMP Unknown)   Wt Readings from Last 3 Encounters:  06/28/18 193 lb 7 oz (87.7 kg)  03/08/18 193 lb 6 oz (87.7 kg)  11/05/17 180 lb (81.6 kg)    Physical Exam   Unable to perform, telephone only visit.  Results for orders placed or performed in visit on 12/12/18  PPD  Result Value Ref Range   TB Skin Test Negative    Induration 0 mm      Assessment & Plan:   Problem List Items Addressed  This Visit      Cardiovascular and Mediastinum   Hypertension - Primary    Chronic, ongoing with BP above goal.  Increase Lisinopril to 40 MG daily and recommended she decrease sodium in diet, focus on DASH.  Continue Hygroton and Amlodipine.  Have come in for outpatient labs.  Return in 4 weeks to office for f/u.       Relevant Medications   lisinopril (ZESTRIL) 40 MG tablet     Other   Hyperlipidemia    Chronic, ongoing.  Continue current medication and adjust as needed.  Labs outpatient.      Relevant Medications   lisinopril (ZESTRIL) 40 MG tablet      I discussed the assessment and treatment plan with the patient. The patient was provided an opportunity to ask questions and all were answered. The patient agreed with the plan and demonstrated an understanding of the instructions.   The patient was advised to call back or seek an in-person evaluation if the symptoms worsen or if the condition fails to improve as anticipated.   I provided 15 minutes of time during this encounter.  Follow up plan: Return in about 4 weeks (around 03/10/2019) for HTN follow-up.

## 2019-02-10 NOTE — Telephone Encounter (Signed)
Patient has appointment today

## 2019-02-10 NOTE — Assessment & Plan Note (Signed)
Chronic, ongoing with BP above goal.  Increase Lisinopril to 40 MG daily and recommended she decrease sodium in diet, focus on DASH.  Continue Hygroton and Amlodipine.  Have come in for outpatient labs.  Return in 4 weeks to office for f/u.

## 2019-02-10 NOTE — Assessment & Plan Note (Signed)
Chronic, ongoing.  Continue current medication and adjust as needed.  Labs outpatient.

## 2019-02-10 NOTE — Patient Instructions (Signed)

## 2019-05-17 ENCOUNTER — Other Ambulatory Visit: Payer: Self-pay | Admitting: Nurse Practitioner

## 2019-05-30 ENCOUNTER — Other Ambulatory Visit: Payer: Self-pay | Admitting: Nurse Practitioner

## 2019-05-30 NOTE — Telephone Encounter (Signed)
Requested Prescriptions  Pending Prescriptions Disp Refills  . chlorthalidone (HYGROTON) 25 MG tablet [Pharmacy Med Name: Chlorthalidone 25 MG Oral Tablet] 90 tablet 0    Sig: Take 1 tablet by mouth once daily     Cardiovascular: Diuretics - Thiazide Failed - 05/30/2019  7:20 AM      Failed - Ca in normal range and within 360 days    Calcium  Date Value Ref Range Status  06/15/2017 9.8 8.7 - 10.2 mg/dL Final         Failed - Cr in normal range and within 360 days    Creatinine, Ser  Date Value Ref Range Status  06/15/2017 0.69 0.57 - 1.00 mg/dL Final         Failed - K in normal range and within 360 days    Potassium  Date Value Ref Range Status  06/15/2017 3.4 (L) 3.5 - 5.2 mmol/L Final         Failed - Na in normal range and within 360 days    Sodium  Date Value Ref Range Status  06/15/2017 139 134 - 144 mmol/L Final         Failed - Last BP in normal range    BP Readings from Last 1 Encounters:  02/10/19 (!) 153/79         Passed - Valid encounter within last 6 months    Recent Outpatient Visits          3 months ago Essential hypertension   [place], [place] T, NP   11 months ago Essential hypertension   [place], Henrine Screws T, NP   1 year ago Essential hypertension   [place], Henrine Screws T, NP   1 year ago Viral upper respiratory tract infection   [facility] Kathrine Haddock, NP   1 year ago Essential hypertension   [place], [place], DO

## 2019-08-24 ENCOUNTER — Other Ambulatory Visit: Payer: Self-pay

## 2019-08-24 ENCOUNTER — Encounter: Payer: Self-pay | Admitting: Emergency Medicine

## 2019-08-24 ENCOUNTER — Ambulatory Visit
Admission: EM | Admit: 2019-08-24 | Discharge: 2019-08-24 | Disposition: A | Discharge status: Home / Self Care | Payer: BLUE CROSS/BLUE SHIELD | Attending: Emergency Medicine | Admitting: Emergency Medicine

## 2019-08-24 DIAGNOSIS — R079 Chest pain, unspecified: Secondary | ICD-10-CM

## 2019-08-24 DIAGNOSIS — R0602 Shortness of breath: Secondary | ICD-10-CM

## 2019-08-24 DIAGNOSIS — R609 Edema, unspecified: Secondary | ICD-10-CM | POA: Insufficient documentation

## 2019-08-24 LAB — FIBRIN DERIVATIVES D-DIMER (ARMC ONLY): Fibrin derivatives D-dimer (ARMC): 380.01 ng/mL (FEU) (ref 0.00–499.00)

## 2019-08-24 NOTE — ED Provider Notes (Signed)
[facility]    CSN: [id number] Arrival date & time: 08/24/19  1037      History   Chief Complaint Chief Complaint  Patient presents with  . Leg Swelling    bilateral ankle    HPI Allison Reynolds is a 59 y.o. female.   HPI  59 year old female presents with bilateral ankle and leg swelling.  She states that it started about 3 to 4 days ago but has seemed to worsen today.  She had difficulty putting her shoes on.  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  Was about 1 week ago.  No pain in her legs has not noticed any tenderness with.  Her legs feel "heavy".  For the last 2 to 3 weeks she also mentioned that she has "a funny feeling" in her chest from time to time.  It is not accompanied by diaphoresis nausea or vomiting or shortness of breath.  She thinks that she may have "pulled) something.  It lasted for only a very short time.  Dates it is variable as to activity level.  She has no previous history of coronary disease.  He has no known family history of coronary disease.  She is a poor historian.        Past Medical History:  Diagnosis Date  . Arthritis   . Heart murmur   . Hypertension   . Thyroid disease     Patient Active Problem List   Diagnosis Date Noted  . Screening for tuberculosis 12/15/2018  . Muscle cramps 09/14/2017  . PVC (premature ventricular contraction) 06/15/2017  . Hyperlipidemia 02/23/2017  . Systolic murmur 02/23/2017  . Hypertension 01/12/2017  . Encounter to establish care 01/12/2017  . Lump or mass in breast 08/24/2012    Past Surgical History:  Procedure Laterality Date  . ABDOMINAL HYSTERECTOMY  age 11  . BREAST BIOPSY Right 2016   benign  . CESAREAN SECTION  1981, 1983    OB History    Gravida  3   Para  3   Term      Preterm      AB  0   Living  2     SAB  0   TAB      Ectopic      Multiple      Live Births           Obstetric  Comments  Age first menstrual period 66 Age first pregnancy 19         Home Medications    Prior to Admission medications   Medication Sig Start Date End Date Taking? Authorizing Provider  amLODipine (NORVASC) 10 MG tablet Take 1 tablet by mouth once daily 05/17/19  Yes Cannady, Jolene T, NP  atorvastatin (LIPITOR) 20 MG tablet Take 1 tablet by mouth once daily 11/05/18  Yes Cannady, Jolene T, NP  chlorthalidone (HYGROTON) 25 MG tablet Take 1 tablet by mouth once daily 05/30/19  Yes Cannady, Jolene T, NP  lisinopril (ZESTRIL) 40 MG tablet Take 1 tablet (40 mg total) by mouth daily. 02/10/19  Yes Marjie Skiff, NP    Family History Family History  Problem Relation Age of Onset  . Hypertension Mother   . Hypertension Father   . Heart murmur Father   . Hypertension Daughter   . Lupus Paternal Aunt     Social History Social History   Tobacco Use  . Smoking  status: Current Some Day Smoker    Years: 2.00    Types: Cigarettes    Last attempt to quit: 01/06/2018    Years since quitting: 1.6  . Smokeless tobacco: Never Used  . Tobacco comment: 1 pack per week  Substance Use Topics  . Alcohol use: Yes    Comment: occasionally  . Drug use: No     Allergies   Patient has no known allergies.   Review of Systems Review of Systems  Constitutional: Negative for activity change, appetite change, chills, fatigue and fever.  Respiratory: Negative for cough and shortness of breath.   Cardiovascular: Positive for leg swelling. Negative for palpitations.  All other systems reviewed and are negative.    Physical Exam Triage Vital Signs ED Triage Vitals  Enc Vitals Group     BP 08/24/19 1107 (!) 163/68     Pulse Rate 08/24/19 1107 89     Resp 08/24/19 1107 18     Temp 08/24/19 1107 98.2 F (36.8 C)     Temp Source 08/24/19 1107 Oral     SpO2 08/24/19 1107 100 %     Weight 08/24/19 1103 180 lb (81.6 kg)     Height 08/24/19 1103 5\' 6"  (1.676 m)     Head Circumference --        Peak Flow --      Pain Score 08/24/19 1103 0     Pain Loc --      Pain Edu? --      Excl. in [place]? --    No data found.  Updated Vital Signs BP (!) 163/68 (BP Location: Right Arm)   Pulse 89   Temp 98.2 F (36.8 C) (Oral)   Resp 18   Ht 5\' 6"  (1.676 m)   Wt 180 lb (81.6 kg)   LMP  (LMP Unknown)   SpO2 100%   BMI 29.05 kg/m   Visual Acuity Right Eye Distance:   Left Eye Distance:   Bilateral Distance:    Right Eye Near:   Left Eye Near:    Bilateral Near:     Physical Exam Vitals and nursing note reviewed.  Constitutional:      General: She is not in acute distress.    Appearance: Normal appearance. She is not ill-appearing or toxic-appearing.  HENT:     Head: Normocephalic and atraumatic.  Eyes:     Conjunctiva/sclera: Conjunctivae normal.  Cardiovascular:     Rate and Rhythm: Normal rate. Rhythm irregular.     Heart sounds: Murmur present.     Comments: Patient has a soft systolic murmur in the second left intercostal space shortly after S1-S2 is present.  It is a grade 2/6. Pulmonary:     Breath sounds: Normal breath sounds.  Musculoskeletal:        General: No swelling or tenderness. Normal range of motion.     Cervical back: Normal range of motion and neck supple.     Comments: Examination of the lower extremities shows very mild pitting edema pretibial on the right and 1+ on the left.  There is no warmth there is no erythema there is no tenderness to compression.  The left calf circumference is 40 cm and the right is 39 cm.  Skin:    General: Skin is warm and dry.  Neurological:     General: No focal deficit present.     Mental Status: She is alert and oriented to person, place, and time.  Psychiatric:  Mood and Affect: Mood normal.        Behavior: Behavior normal.        Thought Content: Thought content normal.        Judgment: Judgment normal.      UC Treatments / Results  Labs (all labs ordered are listed, but only abnormal results  are displayed) Labs Reviewed  FIBRIN DERIVATIVES D-DIMER [facility] ONLY)    EKG EKG taken today and interpreted independently by myself showed a sinus rhythm with frequent PVCs.  Possible left atrial enlargement.  Ventricular rate of 86 bpm.  PR interval 168 QTC of 459 ms.  She has ST and T wave abnormality V2 V3 and V4.  Compared to previous EKGs there is no significant change.  Radiology No results found.  Procedures Procedures (including critical care time)  Medications Ordered in UC Medications - No data to display  Initial Impression / Assessment and Plan / UC Course  I have reviewed the triage vital signs and the nursing notes.  Pertinent labs & imaging results that were available during my care of the patient were reviewed by me and considered in my medical decision making (see chart for details).   59 year old female presents with swelling of both of her legs slightly worse on the left.  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  She states that she noticed 3 to 4 days ago the beginning of the swelling of her lower extremities.  This morning seemed worse she was unable to put on her shoe comfortably.  Shortness of breath chest pain wheezing has had no leg pain no calf pain has not noticed any tenderness any warmth or redness of her lower extremities.  She also complained of very vague chest pain that lasts only several seconds in duration and happens independent of activity.  There is no associated nausea vomiting diaphoresis radiation.  She has no history of coronary artery disease.  Examination showed only mild pitting edema more prevalent on the left than the right and graded 1+.  Calves were soft nontender there is no erythema there is no warmth.  Measurements were only 1 cm difference in calf circumference.  EKG was obtained and compared to previous EKGs dated 06/15/2017 and 02/19/2017 with no significant  changes seen.  Her previous medical records were reviewed in detail.  I obtained a D-dimer for completeness to rule out DVT although she had very low probability on Wells criteria. D-dimer was negative.  The patient was notified of the results.  She had been given a prescription for medium compression hose that she will have filled and use during the daytime.  They may remove at nighttime.  She continues to have swelling she should be seen by her primary care physician for follow-up.  Final Clinical Impressions(s) / UC Diagnoses   Final diagnoses:  Peripheral edema     Discharge Instructions     Elevate both of your legs above the level of your heart sufficiently to control swelling.  Recommend during the day to wear compression hose medium compression.  Use and on both of your legs.  Instead of sitting for prolonged periods without moving I recommend standing and walking for 3 to 5 minutes every 20 minutes.  If you continue to have swelling of your lower extremities recommend following up with your primary care physician.    ED Prescriptions    None  PDMP not reviewed this encounter.   Lutricia Feil, PA-C 08/24/19 2125

## 2019-08-24 NOTE — ED Triage Notes (Addendum)
Pt c/o bilateral ankle and leg swelling. Started about 3-4 days ago. Swelling has not gone over night. 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 She states that she has a "funny feeling" in her chest from time to time. She can not describe the feeling. She states she has a known heart murmur.

## 2019-08-24 NOTE — Discharge Instructions (Addendum)
Elevate both of your legs above the level of your heart sufficiently to control swelling.  Recommend during the day to wear compression hose medium compression.  Use and on both of your legs.  Instead of sitting for prolonged periods without moving I recommend standing and walking for 3 to 5 minutes every 20 minutes.  If you continue to have swelling of your lower extremities recommend following up with your primary care physician.

## 2019-12-10 ENCOUNTER — Ambulatory Visit (INDEPENDENT_AMBULATORY_CARE_PROVIDER_SITE_OTHER): Payer: Self-pay

## 2019-12-10 ENCOUNTER — Ambulatory Visit
Admission: EM | Admit: 2019-12-10 | Discharge: 2019-12-10 | Disposition: A | Discharge status: Home / Self Care | Payer: Self-pay | Attending: Internal Medicine | Admitting: Internal Medicine

## 2019-12-10 ENCOUNTER — Other Ambulatory Visit: Payer: Self-pay

## 2019-12-10 ENCOUNTER — Encounter: Payer: Self-pay | Admitting: Emergency Medicine

## 2019-12-10 DIAGNOSIS — M1712 Unilateral primary osteoarthritis, left knee: Secondary | ICD-10-CM

## 2019-12-10 MED ORDER — KETOROLAC TROMETHAMINE 60 MG/2ML IM SOLN
30.0000 mg | Freq: Once | INTRAMUSCULAR | Status: AC
  Administered 2019-12-10: 30 mg via INTRAMUSCULAR

## 2019-12-10 MED ORDER — MELOXICAM 7.5 MG PO TABS: 7.5000 mg | ORAL_TABLET | Freq: Every day | ORAL | 1 refills | Status: AC

## 2019-12-10 MED ORDER — DICLOFENAC SODIUM 1 % EX GEL: 4.0000 g | Freq: Four times a day (QID) | CUTANEOUS | 0 refills | Status: DC

## 2019-12-10 MED ORDER — ACETAMINOPHEN 500 MG PO TABS: 500.0000 mg | ORAL_TABLET | Freq: Four times a day (QID) | ORAL | 0 refills | Status: DC | PRN

## 2019-12-10 NOTE — ED Provider Notes (Addendum)
[facility]    CSN: [id number] Arrival date & time: 12/10/19  0847      History   Chief Complaint Chief Complaint  Patient presents with  . Knee Pain  . Foot Swelling    HPI Lucynda Rosano is a 59 y.o. female comes to the urgent care with complaints of severe, throbbing-sharp left knee pain which has been intermittent over the past few months but got worse over the past couple of days.  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  No falls or trauma.  She has tried over-the-counter medications with no improvement.  She has been using compression hose a couple of times a week.  She also experiences intermittent left foot swelling.   No fever or chills.  Patient is somewhat upset with her symptoms.  Patient is hypertensive-currently on amlodipine and chlorthalidone.  She denies any headaches, chest pain or chest pressure.  No abdominal pain.  She is compliant with her medications.  She worked last night and she is currently experiencing left knee pain. HPI  Past Medical History:  Diagnosis Date  . Arthritis   . Heart murmur   . Hypertension   . Thyroid disease     Patient Active Problem List   Diagnosis Date Noted  . Screening for tuberculosis 12/15/2018  . Muscle cramps 09/14/2017  . PVC (premature ventricular contraction) 06/15/2017  . Hyperlipidemia 02/23/2017  . Systolic murmur 02/23/2017  . Hypertension 01/12/2017  . Encounter to establish care 01/12/2017  . Lump or mass in breast 08/24/2012    Past Surgical History:  Procedure Laterality Date  . ABDOMINAL HYSTERECTOMY  age 57  . BREAST BIOPSY Right 2016   benign  . CESAREAN SECTION  1981, 1983    OB History    Gravida  3   Para  3   Term      Preterm      AB  0   Living  2     SAB  0   TAB      Ectopic      Multiple      Live Births           Obstetric Comments  Age first menstrual period 66 Age first pregnancy 79         Home  Medications    Prior to Admission medications   Medication Sig Start Date End Date Taking? Authorizing Provider  amLODipine (NORVASC) 10 MG tablet Take 1 tablet by mouth once daily 05/17/19  Yes Cannady, Jolene T, NP  atorvastatin (LIPITOR) 20 MG tablet Take 1 tablet by mouth once daily 11/05/18  Yes Cannady, Jolene T, NP  chlorthalidone (HYGROTON) 25 MG tablet Take 1 tablet by mouth once daily 05/30/19  Yes Cannady, Jolene T, NP  lisinopril (ZESTRIL) 40 MG tablet Take 1 tablet (40 mg total) by mouth daily. 02/10/19  Yes Cannady, Jolene T, NP  acetaminophen (TYLENOL) 500 MG tablet Take 1 tablet (500 mg total) by mouth every 6 (six) hours as needed. 12/10/19   Jerran Tappan, Britta Mccreedy, MD  diclofenac Sodium (VOLTAREN) 1 % GEL Apply 4 g topically 4 (four) times daily. 12/10/19   Keeana Pieratt, Britta Mccreedy, MD  meloxicam (MOBIC) 7.5 MG tablet Take 1 tablet (7.5 mg total) by mouth daily. 12/10/19 02/08/20  Merrilee Jansky, MD    Family History Family History  Problem Relation Age of Onset  . Hypertension Mother   . Hypertension Father   .  Heart murmur Father   . Hypertension Daughter   . Lupus Paternal Aunt     Social History Social History   Tobacco Use  . Smoking status: Current Some Day Smoker    Years: 2.00    Types: Cigarettes  . Smokeless tobacco: Never Used  . Tobacco comment: 1 pack per week  Vaping Use  . Vaping Use: Never used  Substance Use Topics  . Alcohol use: Yes    Comment: occasionally  . Drug use: No     Allergies   Patient has no known allergies.   Review of Systems Review of Systems  Constitutional: Negative.   Respiratory: Negative.   Cardiovascular: Negative for chest pain.  Gastrointestinal: Negative.  Negative for abdominal pain.  Genitourinary: Negative.   Musculoskeletal: Positive for arthralgias and joint swelling. Negative for gait problem and myalgias.  Neurological: Negative for headaches.     Physical Exam Triage Vital Signs ED Triage Vitals  Enc  Vitals Group     BP 12/10/19 0928 (!) 198/107     Pulse Rate 12/10/19 0928 73     Resp 12/10/19 0928 18     Temp 12/10/19 0928 98.1 F (36.7 C)     Temp Source 12/10/19 0928 Oral     SpO2 12/10/19 0928 100 %     Weight 12/10/19 0928 185 lb (83.9 kg)     Height 12/10/19 0928 5\' 6"  (1.676 m)     Head Circumference --      Peak Flow --      Pain Score 12/10/19 0926 7     Pain Loc --      Pain Edu? --      Excl. in GC? --    No data found.  Updated Vital Signs BP (!) 190/78 (BP Location: Left Arm)   Pulse 73   Temp 98.1 F (36.7 C) (Oral)   Resp 18   Ht 5\' 6"  (1.676 m)   Wt 83.9 kg   LMP  (LMP Unknown)   SpO2 100%   BMI 29.86 kg/m   Visual Acuity Right Eye Distance:   Left Eye Distance:   Bilateral Distance:    Right Eye Near:   Left Eye Near:    Bilateral Near:     Physical Exam Vitals reviewed.  Constitutional:      General: She is not in acute distress.    Appearance: She is not ill-appearing.  Cardiovascular:     Rate and Rhythm: Normal rate and regular rhythm.     Pulses: Normal pulses.     Heart sounds: Normal heart sounds.  Pulmonary:     Effort: Pulmonary effort is normal.     Breath sounds: Normal breath sounds.  Musculoskeletal:        General: No swelling, tenderness, deformity or signs of injury. Normal range of motion.     Cervical back: Normal range of motion.  Skin:    General: Skin is warm.     Capillary Refill: Capillary refill takes less than 2 seconds.     Coloration: Skin is not jaundiced.     Findings: No bruising or erythema.  Neurological:     Mental Status: She is alert.      UC Treatments / Results  Labs (all labs ordered are listed, but only abnormal results are displayed) Labs Reviewed - No data to display  EKG   Radiology DG Knee Complete 4 Views Left  Result Date: 12/10/2019 CLINICAL DATA:  LEFT knee pain with  intermittent swelling EXAM: LEFT KNEE - COMPLETE 4+ VIEW COMPARISON:  None. FINDINGS: No evidence of  fracture, dislocation, or joint effusion. No evidence of arthropathy or other focal bone abnormality. Soft tissues are unremarkable. IMPRESSION: Negative. Electronically Signed   By: Bary Richard M.D.   On: 12/10/2019 10:13    Procedures Procedures (including critical care time)  Medications Ordered in UC Medications  ketorolac (TORADOL) injection 30 mg (30 mg Intramuscular Given 12/10/19 1014)    Initial Impression / Assessment and Plan / UC Course  I have reviewed the triage vital signs and the nursing notes.  Pertinent labs & imaging results that were available during my care of the patient were reviewed by me and considered in my medical decision making (see chart for details).     1.  Left knee pain-primary osteoarthritis of the left knee: X-ray of the left knee is negative for acute fracture Meloxicam 7.5 mg orally daily Left knee brace Icing of the knee after prolonged activity Tylenol as needed for left knee pain Voltaren gel as needed for left knee pain If symptoms worsen please return to urgent care to be reevaluated.  2.  Uncontrolled hypertension likely secondary to pain: Toradol 30mg  IM x1 dose Patient is encouraged to continue taking care antihypertensive agents Patient verbalizes understanding. Final Clinical Impressions(s) / UC Diagnoses   Final diagnoses:  Primary osteoarthritis of left knee   Discharge Instructions   None    ED Prescriptions    Medication Sig Dispense Auth. Provider   meloxicam (MOBIC) 7.5 MG tablet Take 1 tablet (7.5 mg total) by mouth daily. 30 tablet Roan Sawchuk, , MD   acetaminophen (TYLENOL) 500 MG tablet Take 1 tablet (500 mg total) by mouth every 6 (six) hours as needed. 30 tablet Nadine Ryle, Britta Mccreedy, MD   diclofenac Sodium (VOLTAREN) 1 % GEL Apply 4 g topically 4 (four) times daily. 150 g Swade Shonka, Britta Mccreedy, MD     PDMP not reviewed this encounter.   Britta Mccreedy, MD 12/10/19 1111    02/10/20, MD 12/10/19  1112

## 2019-12-10 NOTE — ED Triage Notes (Signed)
Patient in today c/o left foot swelling off & on x 2 days. Patient also c/o continued left knee pain x 3 months. Patient seen 07/2019 for same. Patient has been taking OTC Move Free joint health, Biofreeze, voltaren gel, arthritis hot without relief. Patient has been using the compression hose "a couple of times a week" that were suggested at 07/2019 visit.

## 2019-12-10 NOTE — ED Triage Notes (Signed)
Patient left prior to getting knee brace/sleeve ordered by Dr. Leonides Grills. Called and left message for patient to call.

## 2020-01-30 ENCOUNTER — Other Ambulatory Visit: Payer: Self-pay

## 2020-01-30 ENCOUNTER — Encounter: Payer: Self-pay | Admitting: Nurse Practitioner

## 2020-01-30 ENCOUNTER — Ambulatory Visit (INDEPENDENT_AMBULATORY_CARE_PROVIDER_SITE_OTHER): Payer: Self-pay | Admitting: Nurse Practitioner

## 2020-01-30 VITALS — BP 140/82 | HR 62 | Temp 97.8°F | Wt 193.6 lb

## 2020-01-30 DIAGNOSIS — E669 Obesity, unspecified: Secondary | ICD-10-CM | POA: Insufficient documentation

## 2020-01-30 DIAGNOSIS — E782 Mixed hyperlipidemia: Secondary | ICD-10-CM

## 2020-01-30 DIAGNOSIS — Z6831 Body mass index (BMI) 31.0-31.9, adult: Secondary | ICD-10-CM

## 2020-01-30 DIAGNOSIS — I10 Essential (primary) hypertension: Secondary | ICD-10-CM

## 2020-01-30 DIAGNOSIS — E6609 Other obesity due to excess calories: Secondary | ICD-10-CM

## 2020-01-30 MED ORDER — CHLORTHALIDONE 25 MG PO TABS: 25.0000 mg | ORAL_TABLET | Freq: Every day | ORAL | 4 refills | Status: DC

## 2020-01-30 MED ORDER — AMLODIPINE BESYLATE 10 MG PO TABS: 10.0000 mg | ORAL_TABLET | Freq: Every day | ORAL | 4 refills | Status: DC

## 2020-01-30 MED ORDER — LISINOPRIL 40 MG PO TABS: 40.0000 mg | ORAL_TABLET | Freq: Every day | ORAL | 4 refills | Status: DC

## 2020-01-30 NOTE — Assessment & Plan Note (Signed)
Chronic, ongoing.  Recommend she take Atorvastatin, has not taken in months, and discussed ASCVD scoring.  She does not wish to restart at this time and will consider in future.  Return in 6 months.

## 2020-01-30 NOTE — Assessment & Plan Note (Signed)
Recommended eating smaller high protein, low fat meals more frequently and exercising 30 mins a day 5 times a week with a goal of 10-15lb weight loss in the next 3 months. Patient voiced their understanding and motivation to adhere to these recommendations.  

## 2020-01-30 NOTE — Patient Instructions (Signed)
DASH Eating Plan DASH stands for "Dietary Approaches to Stop Hypertension." The DASH eating plan is a healthy eating plan that has been shown to reduce high blood pressure (hypertension). It may also reduce your risk for type 2 diabetes, heart disease, and stroke. The DASH eating plan may also help with weight loss. What are tips for following this plan?  General guidelines  Avoid eating more than 2,300 mg (milligrams) of salt (sodium) a day. If you have hypertension, you may need to reduce your sodium intake to 1,500 mg a day.  Limit alcohol intake to no more than 1 drink a day for nonpregnant women and 2 drinks a day for men. One drink equals 12 oz of beer, 5 oz of wine, or 1 oz of hard liquor.  Work with your health care provider to maintain a healthy body weight or to lose weight. Ask what an ideal weight is for you.  Get at least 30 minutes of exercise that causes your heart to beat faster (aerobic exercise) most days of the week. Activities may include walking, swimming, or biking.  Work with your health care provider or diet and nutrition specialist (dietitian) to adjust your eating plan to your individual calorie needs. Reading food labels   Check food labels for the amount of sodium per serving. Choose foods with less than 5 percent of the Daily Value of sodium. Generally, foods with less than 300 mg of sodium per serving fit into this eating plan.  To find whole grains, look for the word "whole" as the first word in the ingredient list. Shopping  Buy products labeled as "low-sodium" or "no salt added."  Buy fresh foods. Avoid canned foods and premade or frozen meals. Cooking  Avoid adding salt when cooking. Use salt-free seasonings or herbs instead of table salt or sea salt. Check with your health care provider or pharmacist before using salt substitutes.  Do not fry foods. Cook foods using healthy methods such as baking, boiling, grilling, and broiling instead.  Cook with  heart-healthy oils, such as olive, canola, soybean, or sunflower oil. Meal planning  Eat a balanced diet that includes: ? 5 or more servings of fruits and vegetables each day. At each meal, try to fill half of your plate with fruits and vegetables. ? Up to 6-8 servings of whole grains each day. ? Less than 6 oz of lean meat, poultry, or fish each day. A 3-oz serving of meat is about the same size as a deck of cards. One egg equals 1 oz. ? 2 servings of low-fat dairy each day. ? A serving of nuts, seeds, or beans 5 times each week. ? Heart-healthy fats. Healthy fats called Omega-3 fatty acids are found in foods such as flaxseeds and coldwater fish, like sardines, salmon, and mackerel.  Limit how much you eat of the following: ? Canned or prepackaged foods. ? Food that is high in trans fat, such as fried foods. ? Food that is high in saturated fat, such as fatty meat. ? Sweets, desserts, sugary drinks, and other foods with added sugar. ? Full-fat dairy products.  Do not salt foods before eating.  Try to eat at least 2 vegetarian meals each week.  Eat more home-cooked food and less restaurant, buffet, and fast food.  When eating at a restaurant, ask that your food be prepared with less salt or no salt, if possible. What foods are recommended? The items listed may not be a complete list. Talk with your dietitian about   what dietary choices are best for you. Grains Whole-grain or whole-wheat bread. Whole-grain or whole-wheat pasta. Brown rice. Oatmeal. Quinoa. Bulgur. Whole-grain and low-sodium cereals. Pita bread. Low-fat, low-sodium crackers. Whole-wheat flour tortillas. Vegetables Fresh or frozen vegetables (raw, steamed, roasted, or grilled). Low-sodium or reduced-sodium tomato and vegetable juice. Low-sodium or reduced-sodium tomato sauce and tomato paste. Low-sodium or reduced-sodium canned vegetables. Fruits All fresh, dried, or frozen fruit. Canned fruit in natural juice (without  added sugar). Meat and other protein foods Skinless chicken or turkey. Ground chicken or turkey. Pork with fat trimmed off. Fish and seafood. Egg whites. Dried beans, peas, or lentils. Unsalted nuts, nut butters, and seeds. Unsalted canned beans. Lean cuts of beef with fat trimmed off. Low-sodium, lean deli meat. Dairy Low-fat (1%) or fat-free (skim) milk. Fat-free, low-fat, or reduced-fat cheeses. Nonfat, low-sodium ricotta or cottage cheese. Low-fat or nonfat yogurt. Low-fat, low-sodium cheese. Fats and oils Soft margarine without trans fats. Vegetable oil. Low-fat, reduced-fat, or light mayonnaise and salad dressings (reduced-sodium). Canola, safflower, olive, soybean, and sunflower oils. Avocado. Seasoning and other foods Herbs. Spices. Seasoning mixes without salt. Unsalted popcorn and pretzels. Fat-free sweets. What foods are not recommended? The items listed may not be a complete list. Talk with your dietitian about what dietary choices are best for you. Grains Baked goods made with fat, such as croissants, muffins, or some breads. Dry pasta or rice meal packs. Vegetables Creamed or fried vegetables. Vegetables in a cheese sauce. Regular canned vegetables (not low-sodium or reduced-sodium). Regular canned tomato sauce and paste (not low-sodium or reduced-sodium). Regular tomato and vegetable juice (not low-sodium or reduced-sodium). Pickles. Olives. Fruits Canned fruit in a light or heavy syrup. Fried fruit. Fruit in cream or butter sauce. Meat and other protein foods Fatty cuts of meat. Ribs. Fried meat. Bacon. Sausage. Bologna and other processed lunch meats. Salami. Fatback. Hotdogs. Bratwurst. Salted nuts and seeds. Canned beans with added salt. Canned or smoked fish. Whole eggs or egg yolks. Chicken or turkey with skin. Dairy Whole or 2% milk, cream, and half-and-half. Whole or full-fat cream cheese. Whole-fat or sweetened yogurt. Full-fat cheese. Nondairy creamers. Whipped toppings.  Processed cheese and cheese spreads. Fats and oils Butter. Stick margarine. Lard. Shortening. Ghee. Bacon fat. Tropical oils, such as coconut, palm kernel, or palm oil. Seasoning and other foods Salted popcorn and pretzels. Onion salt, garlic salt, seasoned salt, table salt, and sea salt. Worcestershire sauce. Tartar sauce. Barbecue sauce. Teriyaki sauce. Soy sauce, including reduced-sodium. Steak sauce. Canned and packaged gravies. Fish sauce. Oyster sauce. Cocktail sauce. Horseradish that you find on the shelf. Ketchup. Mustard. Meat flavorings and tenderizers. Bouillon cubes. Hot sauce and Tabasco sauce. Premade or packaged marinades. Premade or packaged taco seasonings. Relishes. Regular salad dressings. Where to find more information:  National Heart, Lung, and Blood Institute: www.nhlbi.nih.gov  American Heart Association: www.heart.org Summary  The DASH eating plan is a healthy eating plan that has been shown to reduce high blood pressure (hypertension). It may also reduce your risk for type 2 diabetes, heart disease, and stroke.  With the DASH eating plan, you should limit salt (sodium) intake to 2,300 mg a day. If you have hypertension, you may need to reduce your sodium intake to 1,500 mg a day.  When on the DASH eating plan, aim to eat more fresh fruits and vegetables, whole grains, lean proteins, low-fat dairy, and heart-healthy fats.  Work with your health care provider or diet and nutrition specialist (dietitian) to adjust your eating plan to your   individual calorie needs. This information is not intended to replace advice given to you by your health care provider. Make sure you discuss any questions you have with your health care provider. Document Revised: 04/30/2017 Document Reviewed: 05/11/2016 Elsevier Patient Education  2020 Elsevier Inc.  

## 2020-01-30 NOTE — Progress Notes (Signed)
BP 140/82 (BP Location: Left Arm)    Pulse 62    Temp 97.8 F (36.6 C) (Oral)    Wt 193 lb 9.6 oz (87.8 kg)    LMP  (LMP Unknown)    SpO2 99%    BMI 31.25 kg/m    Subjective:    Patient ID: Allison Reynolds, female    DOB: [birth year], 59 y.o.   MRN: [id number]  HPI: Mikiya Nebergall is a 59 y.o. female presents for HTN f/u  Chief Complaint  Patient presents with   Hypertension   HYPERTENSION Continues on Lisinopril, Hygroton, and Amlodipine -- does not take consistently due to cost and no insurance -- does endorse sporadic taking.   Hypertension status: stable  Satisfied with current treatment? yes Duration of hypertension: chronic BP monitoring frequency:  rarely BP range: 140-150/70-80 BP medication side effects:  no Medication compliance: good compliance Aspirin: no Recurrent headaches: no Visual changes: no Palpitations: no Dyspnea: no Chest pain: no Lower extremity edema: no Dizzy/lightheaded: no  The 10-year ASCVD risk score Denman George DC Jr., et al., 2013) is: 12.6%   Values used to calculate the score:     Age: 24 years     Sex: Female     Is Non-Hispanic African American: Yes     Diabetic: No     Tobacco smoker: Yes     Systolic Blood Pressure: 140 mmHg     Is BP treated: Yes     HDL Cholesterol: 71 mg/dL     Total Cholesterol: 156 mg/dL   Relevant past medical, surgical, family and social history reviewed and updated as indicated. Interim medical history since our last visit reviewed. Allergies and medications reviewed and updated.  Review of Systems  Constitutional: Negative for activity change, appetite change, diaphoresis, fatigue and fever.  Respiratory: Negative for cough, chest tightness and shortness of breath.   Cardiovascular: Negative for chest pain, palpitations and leg swelling.  Gastrointestinal: Negative.   Neurological: Negative.   Psychiatric/Behavioral: Negative.     Per HPI unless specifically indicated above     Objective:    BP  140/82 (BP Location: Left Arm)    Pulse 62    Temp 97.8 F (36.6 C) (Oral)    Wt 193 lb 9.6 oz (87.8 kg)    LMP  (LMP Unknown)    SpO2 99%    BMI 31.25 kg/m   Wt Readings from Last 3 Encounters:  01/30/20 193 lb 9.6 oz (87.8 kg)  12/10/19 185 lb (83.9 kg)  08/24/19 180 lb (81.6 kg)    Physical Exam Vitals and nursing note reviewed.  Constitutional:      General: She is awake.     Appearance: She is well-developed.  HENT:     Head: Normocephalic.     Right Ear: Hearing normal.     Left Ear: Hearing normal.     Nose: Nose normal.     Mouth/Throat:     Mouth: Mucous membranes are moist.  Eyes:     General: Lids are normal.        Right eye: No discharge.        Left eye: No discharge.     Conjunctiva/sclera: Conjunctivae normal.     Pupils: Pupils are equal, round, and reactive to light.  Neck:     Thyroid: No thyromegaly.     Vascular: No carotid bruit or JVD.  Cardiovascular:     Rate and Rhythm: Normal rate and regular rhythm.  Heart sounds: Murmur heard.  Systolic murmur is present with a grade of 2/6.  No gallop.   Pulmonary:     Effort: Pulmonary effort is normal.     Breath sounds: Normal breath sounds.  Abdominal:     General: Bowel sounds are normal.     Palpations: Abdomen is soft. There is no hepatomegaly or splenomegaly.  Musculoskeletal:     Cervical back: Normal range of motion and neck supple.     Right lower leg: No edema.     Left lower leg: No edema.  Lymphadenopathy:     Cervical: No cervical adenopathy.  Skin:    General: Skin is warm and dry.  Neurological:     Mental Status: She is alert and oriented to person, place, and time.  Psychiatric:        Attention and Perception: Attention normal.        Mood and Affect: Mood normal.        Behavior: Behavior normal. Behavior is cooperative.        Thought Content: Thought content normal.        Judgment: Judgment normal.     Results for orders placed or performed during the hospital  encounter of 08/24/19  Fibrin derivatives D-Dimer  Result Value Ref Range   Fibrin derivatives D-dimer ([facility]) 380.01 0.00 - 499.00 ng/mL (FEU)      Assessment & Plan:   Problem List Items Addressed This Visit      Cardiovascular and Mediastinum   Hypertension - Primary    Chronic, ongoing with BP above goal and improvement, but still above goal on recheck.  Does not consistently take medication and HIGHLY recommended she take daily.  Continue Hygroton, Lisinopril, and Amlodipine.  Refills sent.  Plan on BMP next visit, she refuses labs today.  Could consider CCM referral or send to medication management if cost an issue ongoing.   Check BP at home regularly and focus on DASH diet.  Return in 6 months for follow-up, she is working on [employer] now.      Relevant Medications   amLODipine (NORVASC) 10 MG tablet   chlorthalidone (HYGROTON) 25 MG tablet   lisinopril (ZESTRIL) 40 MG tablet     Other   Hyperlipidemia    Chronic, ongoing.  Recommend she take Atorvastatin, has not taken in months, and discussed ASCVD scoring.  She does not wish to restart at this time and will consider in future.  Return in 6 months.      Relevant Medications   amLODipine (NORVASC) 10 MG tablet   chlorthalidone (HYGROTON) 25 MG tablet   lisinopril (ZESTRIL) 40 MG tablet   Obesity    Recommended eating smaller high protein, low fat meals more frequently and exercising 30 mins a day 5 times a week with a goal of 10-15lb weight loss in the next 3 months. Patient voiced their understanding and motivation to adhere to these recommendations.           Follow up plan: Return in about 6 months (around 07/29/2020) for HTN.

## 2020-01-30 NOTE — Assessment & Plan Note (Addendum)
Chronic, ongoing with BP above goal and improvement, but still above goal on recheck.  Does not consistently take medication and HIGHLY recommended she take daily.  Continue Hygroton, Lisinopril, and Amlodipine.  Refills sent.  Plan on BMP next visit, she refuses labs today.  Could consider CCM referral or send to medication management if cost an issue ongoing.   Check BP at home regularly and focus on DASH diet.  Return in 6 months for follow-up, she is working on [employer] now.

## 2020-08-16 ENCOUNTER — Ambulatory Visit (LOCAL_COMMUNITY_HEALTH_CENTER): Payer: Self-pay

## 2020-08-16 ENCOUNTER — Other Ambulatory Visit: Payer: Self-pay

## 2020-08-16 DIAGNOSIS — Z111 Encounter for screening for respiratory tuberculosis: Secondary | ICD-10-CM

## 2020-08-19 ENCOUNTER — Other Ambulatory Visit: Payer: Self-pay

## 2020-08-19 ENCOUNTER — Ambulatory Visit (LOCAL_COMMUNITY_HEALTH_CENTER): Payer: Self-pay

## 2020-08-19 DIAGNOSIS — Z111 Encounter for screening for respiratory tuberculosis: Secondary | ICD-10-CM

## 2020-08-19 LAB — TB SKIN TEST
Induration: 0 mm
TB Skin Test: NEGATIVE

## 2020-12-15 ENCOUNTER — Encounter: Payer: Self-pay | Admitting: Emergency Medicine

## 2020-12-15 ENCOUNTER — Other Ambulatory Visit: Payer: Self-pay

## 2020-12-15 ENCOUNTER — Ambulatory Visit
Admission: EM | Admit: 2020-12-15 | Discharge: 2020-12-15 | Disposition: A | Discharge status: Home / Self Care | Payer: Self-pay | Attending: Family Medicine | Admitting: Family Medicine

## 2020-12-15 DIAGNOSIS — M5441 Lumbago with sciatica, right side: Secondary | ICD-10-CM | POA: Insufficient documentation

## 2020-12-15 DIAGNOSIS — M5442 Lumbago with sciatica, left side: Secondary | ICD-10-CM | POA: Insufficient documentation

## 2020-12-15 LAB — POCT URINALYSIS DIP (DEVICE)
Bilirubin Urine: NEGATIVE
Glucose, UA: NEGATIVE mg/dL
Hgb urine dipstick: NEGATIVE
Ketones, ur: NEGATIVE mg/dL
Leukocytes,Ua: NEGATIVE
Nitrite: NEGATIVE
Protein, ur: NEGATIVE mg/dL
Specific Gravity, Urine: 1.015 (ref 1.005–1.030)
Urobilinogen, UA: 0.2 mg/dL (ref 0.0–1.0)
pH: 7.5 (ref 5.0–8.0)

## 2020-12-15 MED ORDER — BACLOFEN 10 MG PO TABS: 10.0000 mg | ORAL_TABLET | Freq: Three times a day (TID) | ORAL | 0 refills | Status: DC

## 2020-12-15 MED ORDER — PREDNISONE 10 MG (21) PO TBPK: ORAL_TABLET | ORAL | 0 refills | Status: DC

## 2020-12-15 NOTE — ED Provider Notes (Signed)
[facility]    CSN: [id number] Arrival date & time: 12/15/20  7209      History   Chief Complaint Chief Complaint  Patient presents with   Back Pain    HPI Allison Reynolds is a 60 y.o. female.   HPI  60 year old female here for evaluation of back pain and urinary complaints.  Patient reports that she has been experiencing pain in both sides of her low back with the left being worse than the right since 4 July.  She reports that she does a lot of heavy lifting and bending and the 2 jobs that she works.  She reports that she was off for 2 days before 4 July and she spent both those days in bed.  She reports that when she went to get up on 4 July she developed this sharp low back pain.  She describes the pain as shooting and will sometimes go through her buttock into her legs and will alternate which leg is affected.  Additionally, she is complaining of bladder pressure that is been present for the past week.  She denies any falls or injuries, no loss of bowel or bladder function, no saddle anesthesia, no pain with urination or blood in her urine, no urinary urgency or frequency, no fever, no nausea or vomiting, and no numbness or tingling in her legs.  She does state that when the pain comes sometimes he feels like her leg is going to give out.  Past Medical History:  Diagnosis Date   Arthritis    Heart murmur    Hypertension    Thyroid disease     Patient Active Problem List   Diagnosis Date Noted   Obesity 01/30/2020   PVC (premature ventricular contraction) 06/15/2017   Hyperlipidemia 02/23/2017   Systolic murmur 02/23/2017   Hypertension 01/12/2017    Past Surgical History:  Procedure Laterality Date   ABDOMINAL HYSTERECTOMY  age 45   BREAST BIOPSY Right 2016   benign   CESAREAN SECTION  1981, 1983    OB History     Gravida  3   Para  3   Term      Preterm      AB  0   Living  2      SAB  0   IAB      Ectopic      Multiple       Live Births           Obstetric Comments  Age first menstrual period 28 Age first pregnancy 66          Home Medications    Prior to Admission medications   Medication Sig Start Date End Date Taking? Authorizing Provider  amLODipine (NORVASC) 10 MG tablet Take 1 tablet (10 mg total) by mouth daily. 01/30/20  Yes Cannady, Jolene T, NP  baclofen (LIORESAL) 10 MG tablet Take 1 tablet (10 mg total) by mouth 3 (three) times daily. 12/15/20  Yes Becky Augusta, NP  chlorthalidone (HYGROTON) 25 MG tablet Take 1 tablet (25 mg total) by mouth daily. 01/30/20  Yes Cannady, Jolene T, NP  lisinopril (ZESTRIL) 40 MG tablet Take 1 tablet (40 mg total) by mouth daily. 01/30/20  Yes Cannady, Jolene T, NP  predniSONE (STERAPRED UNI-PAK 21 TAB) 10 MG (21) TBPK tablet Take 6 tablets on day 1, 5 tablets day 2, 4 tablets day 3, 3 tablets day 4, 2 tablets day 5, 1 tablet day 6 12/15/20  Yes Raha Tennison,  Riki Rusk, NP  acetaminophen (TYLENOL) 500 MG tablet Take 1 tablet (500 mg total) by mouth every 6 (six) hours as needed. 12/10/19   Lamptey, Britta Mccreedy, MD  diclofenac Sodium (VOLTAREN) 1 % GEL Apply 4 g topically 4 (four) times daily. 12/10/19   Lamptey, Britta Mccreedy, MD    Family History Family History  Problem Relation Age of Onset   Hypertension Mother    Hypertension Father    Heart murmur Father    Hypertension Daughter    Lupus Paternal Aunt     Social History Social History   Tobacco Use   Smoking status: Some Days    Years: 2.00    Types: Cigarettes   Smokeless tobacco: Never   Tobacco comments:    1 pack per week  Vaping Use   Vaping Use: Never used  Substance Use Topics   Alcohol use: Yes    Comment: occasionally   Drug use: No     Allergies   Patient has no known allergies.   Review of Systems Review of Systems  Constitutional:  Negative for activity change, appetite change and fever.  Gastrointestinal:  Positive for abdominal pain. Negative for nausea and vomiting.  Genitourinary:   Negative for dysuria, frequency, hematuria and urgency.  Musculoskeletal:  Positive for back pain. Negative for arthralgias.  Neurological:  Negative for weakness and numbness.  Hematological: Negative.   Psychiatric/Behavioral: Negative.      Physical Exam Triage Vital Signs ED Triage Vitals [12/15/20 0910]  Enc Vitals Group     BP      Pulse      Resp      Temp      Temp src      SpO2      Weight 180 lb (81.6 kg)     Height 5\' 6"  (1.676 m)     Head Circumference      Peak Flow      Pain Score 6     Pain Loc      Pain Edu?      Excl. in GC?    No data found.  Updated Vital Signs BP (!) 156/80 (BP Location: Right Arm)   Pulse 63   Temp 98 F (36.7 C) (Oral)   Resp 14   Ht 5\' 6"  (1.676 m)   Wt 180 lb (81.6 kg)   LMP  (LMP Unknown)   SpO2 100%   BMI 29.05 kg/m   Visual Acuity Right Eye Distance:   Left Eye Distance:   Bilateral Distance:    Right Eye Near:   Left Eye Near:    Bilateral Near:     Physical Exam Vitals and nursing note reviewed.  Constitutional:      General: She is not in acute distress.    Appearance: Normal appearance. She is normal weight. She is not ill-appearing.  HENT:     Head: Normocephalic and atraumatic.  Cardiovascular:     Rate and Rhythm: Normal rate and regular rhythm.     Pulses: Normal pulses.     Heart sounds: Normal heart sounds. No murmur heard.   No gallop.  Pulmonary:     Effort: Pulmonary effort is normal.     Breath sounds: Normal breath sounds. No wheezing, rhonchi or rales.  Abdominal:     General: Bowel sounds are normal.     Palpations: Abdomen is soft.     Tenderness: There is no abdominal tenderness. There is no right CVA tenderness, left CVA tenderness,  guarding or rebound.  Musculoskeletal:        General: Tenderness present. No swelling or deformity. Normal range of motion.  Skin:    General: Skin is warm and dry.     Capillary Refill: Capillary refill takes less than 2 seconds.  Neurological:      General: No focal deficit present.     Mental Status: She is alert and oriented to person, place, and time.  Psychiatric:        Mood and Affect: Mood normal.        Behavior: Behavior normal.        Thought Content: Thought content normal.        Judgment: Judgment normal.     UC Treatments / Results  Labs (all labs ordered are listed, but only abnormal results are displayed) Labs Reviewed  POCT URINALYSIS DIPSTICK, ED / UC  POCT URINALYSIS DIP (DEVICE)    EKG   Radiology No results found.  Procedures Procedures (including critical care time)  Medications Ordered in UC Medications - No data to display  Initial Impression / Assessment and Plan / UC Course  I have reviewed the triage vital signs and the nursing notes.  Pertinent labs & imaging results that were available during my care of the patient were reviewed by me and considered in my medical decision making (see chart for details).  Patient is a very pleasant and nontoxic-appearing 60 year old female here for evaluation of back pain and urinary/bladder pressure as outlined in HPI above.  Patient reports that her back pain is worse if she has been sitting for a while or bending over or stooping.  She states if she lays down she does not have any pain in her back.  Patient's physical exam reveals a patient with a normal carriage and range of motion of her upper torso.  Cardiopulmonary exam reveals clear lung sounds in all fields.  Patient has no midline spinal tenderness in her lower thoracic or lumbar spine.  She does have very mild tenderness without significant spasm in her bilateral lower paraspinous regions of the lumbar spine.  She does not have any tenderness when palpating into either of her buttock or with palpation to the backs of either leg.  Does have a positive right straight leg raise but a negative left straight leg raise.  Patient's abdomen is soft, nontender, nondistended.  Urinalysis collected at  triage.  Urinalysis is unremarkable.  Patient's exam is consistent with low back pain with sciatica.  We will treat with prednisone pack and baclofen.  Patient has been using over-the-counter ibuprofen and Aleve without relief at home.  Patient also given home PT exercises and encouraged to use moist heat.   Final Clinical Impressions(s) / UC Diagnoses   Final diagnoses:  Acute bilateral low back pain with bilateral sciatica     Discharge Instructions      Take the Prednisone according to the package instructions. Take it with food and each morning with breakfast.  Take the Baclofen, 10 mg every 8 hours, on a schedule for the next 48 hours and then as needed.  Apply moist heat to your back for 30 minutes at a time 2-3 times a day to improve blood flow to the area and help remove the lactic acid causing the spasm.  Follow the back exercises given at discharge.  Return for reevaluation for any new or worsening symptoms.      ED Prescriptions     Medication Sig Dispense Auth. Provider  predniSONE (STERAPRED UNI-PAK 21 TAB) 10 MG (21) TBPK tablet Take 6 tablets on day 1, 5 tablets day 2, 4 tablets day 3, 3 tablets day 4, 2 tablets day 5, 1 tablet day 6 21 tablet Becky Augusta, NP   baclofen (LIORESAL) 10 MG tablet Take 1 tablet (10 mg total) by mouth 3 (three) times daily. 30 each Becky Augusta, NP      PDMP not reviewed this encounter.   Becky Augusta, NP 12/15/20 [phone]

## 2020-12-15 NOTE — Discharge Instructions (Addendum)
Take the Prednisone according to the package instructions. Take it with food and each morning with breakfast.  Take the Baclofen, 10 mg every 8 hours, on a schedule for the next 48 hours and then as needed.  Apply moist heat to your back for 30 minutes at a time 2-3 times a day to improve blood flow to the area and help remove the lactic acid causing the spasm.  Follow the back exercises given at discharge.  Return for reevaluation for any new or worsening symptoms.

## 2020-12-15 NOTE — ED Triage Notes (Signed)
Patient c/o lower back pain that started 2 weeks ago.  Patient describes her pain has sharp and radiates down her right leg.  Patient also reports pressure in her bladder/pelvic.  Patient denies urinary symptoms.

## 2021-08-12 ENCOUNTER — Other Ambulatory Visit: Payer: Self-pay

## 2021-08-12 ENCOUNTER — Ambulatory Visit (LOCAL_COMMUNITY_HEALTH_CENTER): Payer: Self-pay

## 2021-08-12 DIAGNOSIS — Z111 Encounter for screening for respiratory tuberculosis: Secondary | ICD-10-CM

## 2021-08-15 ENCOUNTER — Other Ambulatory Visit: Payer: Self-pay

## 2021-08-15 ENCOUNTER — Ambulatory Visit (LOCAL_COMMUNITY_HEALTH_CENTER): Payer: Self-pay

## 2021-08-15 DIAGNOSIS — Z111 Encounter for screening for respiratory tuberculosis: Secondary | ICD-10-CM

## 2021-08-15 LAB — TB SKIN TEST
Induration: 0 mm
TB Skin Test: NEGATIVE

## 2021-08-16 NOTE — Patient Instructions (Addendum)
Please call to schedule your mammogram and/or bone density: ?[facility]  ?Address: [address], [place], [place] [address] ?Phone: [phone]  ? ?DASH Eating Plan ?DASH stands for Dietary Approaches to Stop Hypertension. The DASH eating plan is a healthy eating plan that has been shown to: ?Reduce high blood pressure (hypertension). ?Reduce your risk for type 2 diabetes, heart disease, and stroke. ?Help with weight loss. ?What are tips for following this plan? ?Reading food labels ?Check food labels for the amount of salt (sodium) per serving. Choose foods with less than 5 percent of the Daily Value of sodium. Generally, foods with less than 300 milligrams (mg) of sodium per serving fit into this eating plan. ?To find whole grains, look for the word "whole" as the first word in the ingredient list. ?Shopping ?Buy products labeled as "low-sodium" or "no salt added." ?Buy fresh foods. Avoid canned foods and pre-made or frozen meals. ?Cooking ?Avoid adding salt when cooking. Use salt-free seasonings or herbs instead of table salt or sea salt. Check with your health care provider or pharmacist before using salt substitutes. ?Do not fry foods. Cook foods using healthy methods such as baking, boiling, grilling, roasting, and broiling instead. ?Cook with heart-healthy oils, such as olive, canola, avocado, soybean, or sunflower oil. ?Meal planning ? ?Eat a balanced diet that includes: ?4 or more servings of fruits and 4 or more servings of vegetables each day. Try to fill one-half of your plate with fruits and vegetables. ?6-8 servings of whole grains each day. ?Less than 6 oz (170 g) of lean meat, poultry, or fish each day. A 3-oz (85-g) serving of meat is about the same size as a deck of cards. One egg equals 1 oz (28 g). ?2-3 servings of low-fat dairy each day. One serving is 1 cup (237 mL). ?1 serving of nuts, seeds, or beans 5 times each week. ?2-3 servings of  heart-healthy fats. Healthy fats called omega-3 fatty acids are found in foods such as walnuts, flaxseeds, fortified milks, and eggs. These fats are also found in cold-water fish, such as sardines, salmon, and mackerel. ?Limit how much you eat of: ?Canned or prepackaged foods. ?Food that is high in trans fat, such as some fried foods. ?Food that is high in saturated fat, such as fatty meat. ?Desserts and other sweets, sugary drinks, and other foods with added sugar. ?Full-fat dairy products. ?Do not salt foods before eating. ?Do not eat more than 4 egg yolks a week. ?Try to eat at least 2 vegetarian meals a week. ?Eat more home-cooked food and less restaurant, buffet, and fast food. ?Lifestyle ?When eating at a restaurant, ask that your food be prepared with less salt or no salt, if possible. ?If you drink alcohol: ?Limit how much you use to: ?0-1 drink a day for women who are not pregnant. ?0-2 drinks a day for men. ?Be aware of how much alcohol is in your drink. In the [place] one drink equals one 12 oz bottle of beer (355 mL), one 5 oz glass of wine (148 mL), or one 1? oz glass of hard liquor (44 mL). ?General information ?Avoid eating more than 2,300 mg of salt a day. If you have hypertension, you may need to reduce your sodium intake to 1,500 mg a day. ?Work with your health care provider to maintain a healthy body weight or to lose weight. Ask what an ideal weight is for you. ?Get at least 30 minutes of   exercise that causes your heart to beat faster (aerobic exercise) most days of the week. Activities may include walking, swimming, or biking. ?Work with your health care provider or dietitian to adjust your eating plan to your individual calorie needs. ?What foods should I eat? ?Fruits ?All fresh, dried, or frozen fruit. Canned fruit in natural juice (without added sugar). ?Vegetables ?Fresh or frozen vegetables (raw, steamed, roasted, or grilled). Low-sodium or reduced-sodium tomato and vegetable juice.  Low-sodium or reduced-sodium tomato sauce and tomato paste. Low-sodium or reduced-sodium canned vegetables. ?Grains ?Whole-grain or whole-wheat bread. Whole-grain or whole-wheat pasta. Brown rice. Oatmeal. Quinoa. Bulgur. Whole-grain and low-sodium cereals. Pita bread. Low-fat, low-sodium crackers. Whole-wheat flour tortillas. ?Meats and other proteins ?Skinless chicken or turkey. Ground chicken or turkey. Pork with fat trimmed off. Fish and seafood. Egg whites. Dried beans, peas, or lentils. Unsalted nuts, nut butters, and seeds. Unsalted canned beans. Lean cuts of beef with fat trimmed off. Low-sodium, lean precooked or cured meat, such as sausages or meat loaves. ?Dairy ?Low-fat (1%) or fat-free (skim) milk. Reduced-fat, low-fat, or fat-free cheeses. Nonfat, low-sodium ricotta or cottage cheese. Low-fat or nonfat yogurt. Low-fat, low-sodium cheese. ?Fats and oils ?Soft margarine without trans fats. Vegetable oil. Reduced-fat, low-fat, or light mayonnaise and salad dressings (reduced-sodium). Canola, safflower, olive, avocado, soybean, and sunflower oils. Avocado. ?Seasonings and condiments ?Herbs. Spices. Seasoning mixes without salt. ?Other foods ?Unsalted popcorn and pretzels. Fat-free sweets. ?The items listed above may not be a complete list of foods and beverages you can eat. Contact a dietitian for more information. ?What foods should I avoid? ?Fruits ?Canned fruit in a light or heavy syrup. Fried fruit. Fruit in cream or butter sauce. ?Vegetables ?Creamed or fried vegetables. Vegetables in a cheese sauce. Regular canned vegetables (not low-sodium or reduced-sodium). Regular canned tomato sauce and paste (not low-sodium or reduced-sodium). Regular tomato and vegetable juice (not low-sodium or reduced-sodium). Pickles. Olives. ?Grains ?Baked goods made with fat, such as croissants, muffins, or some breads. Dry pasta or rice meal packs. ?Meats and other proteins ?Fatty cuts of meat. Ribs. Fried meat. Bacon.  Bologna, salami, and other precooked or cured meats, such as sausages or meat loaves. Fat from the back of a pig (fatback). Bratwurst. Salted nuts and seeds. Canned beans with added salt. Canned or smoked fish. Whole eggs or egg yolks. Chicken or turkey with skin. ?Dairy ?Whole or 2% milk, cream, and half-and-half. Whole or full-fat cream cheese. Whole-fat or sweetened yogurt. Full-fat cheese. Nondairy creamers. Whipped toppings. Processed cheese and cheese spreads. ?Fats and oils ?Butter. Stick margarine. Lard. Shortening. Ghee. Bacon fat. Tropical oils, such as coconut, palm kernel, or palm oil. ?Seasonings and condiments ?Onion salt, garlic salt, seasoned salt, table salt, and sea salt. Worcestershire sauce. Tartar sauce. Barbecue sauce. Teriyaki sauce. Soy sauce, including reduced-sodium. Steak sauce. Canned and packaged gravies. Fish sauce. Oyster sauce. Cocktail sauce. Store-bought horseradish. Ketchup. Mustard. Meat flavorings and tenderizers. Bouillon cubes. Hot sauces. Pre-made or packaged marinades. Pre-made or packaged taco seasonings. Relishes. Regular salad dressings. ?Other foods ?Salted popcorn and pretzels. ?The items listed above may not be a complete list of foods and beverages you should avoid. Contact a dietitian for more information. ?Where to find more information ?National Heart, Lung, and Blood Institute: www.nhlbi.nih.gov ?American Heart Association: www.heart.org ?Academy of Nutrition and Dietetics: www.eatright.org ?National Kidney Foundation: www.kidney.org ?Summary ?The DASH eating plan is a healthy eating plan that has been shown to reduce high blood pressure (hypertension). It may also reduce your risk for type 2   diabetes, heart disease, and stroke. ?When on the DASH eating plan, aim to eat more fresh fruits and vegetables, whole grains, lean proteins, low-fat dairy, and heart-healthy fats. ?With the DASH eating plan, you should limit salt (sodium) intake to 2,300 mg a day. If you have  hypertension, you may need to reduce your sodium intake to 1,500 mg a day. ?Work with your health care provider or dietitian to adjust your eating plan to your individual calorie needs. ?This information is not intend

## 2021-08-19 ENCOUNTER — Other Ambulatory Visit: Payer: Self-pay

## 2021-08-19 ENCOUNTER — Encounter: Payer: Self-pay | Admitting: Nurse Practitioner

## 2021-08-19 ENCOUNTER — Ambulatory Visit (INDEPENDENT_AMBULATORY_CARE_PROVIDER_SITE_OTHER): Payer: Managed Care, Other (non HMO) | Admitting: Nurse Practitioner

## 2021-08-19 VITALS — BP 130/80 | HR 64 | Temp 97.8°F | Ht 66.0 in | Wt 187.0 lb

## 2021-08-19 DIAGNOSIS — E559 Vitamin D deficiency, unspecified: Secondary | ICD-10-CM

## 2021-08-19 DIAGNOSIS — I493 Ventricular premature depolarization: Secondary | ICD-10-CM | POA: Diagnosis not present

## 2021-08-19 DIAGNOSIS — Z1231 Encounter for screening mammogram for malignant neoplasm of breast: Secondary | ICD-10-CM

## 2021-08-19 DIAGNOSIS — Z0000 Encounter for general adult medical examination without abnormal findings: Secondary | ICD-10-CM | POA: Diagnosis not present

## 2021-08-19 DIAGNOSIS — Z6831 Body mass index (BMI) 31.0-31.9, adult: Secondary | ICD-10-CM

## 2021-08-19 DIAGNOSIS — E6609 Other obesity due to excess calories: Secondary | ICD-10-CM

## 2021-08-19 DIAGNOSIS — Z1211 Encounter for screening for malignant neoplasm of colon: Secondary | ICD-10-CM | POA: Diagnosis not present

## 2021-08-19 DIAGNOSIS — M2550 Pain in unspecified joint: Secondary | ICD-10-CM | POA: Insufficient documentation

## 2021-08-19 DIAGNOSIS — E782 Mixed hyperlipidemia: Secondary | ICD-10-CM

## 2021-08-19 DIAGNOSIS — R011 Cardiac murmur, unspecified: Secondary | ICD-10-CM

## 2021-08-19 DIAGNOSIS — I10 Essential (primary) hypertension: Secondary | ICD-10-CM

## 2021-08-19 LAB — MICROALBUMIN, URINE WAIVED
Creatinine, Urine Waived: 10 mg/dL (ref 10–300)
Microalb, Ur Waived: 10 mg/L (ref 0–19)
Microalb/Creat Ratio: <30 mg/g (ref <30)

## 2021-08-19 MED ORDER — AMLODIPINE BESYLATE 10 MG PO TABS: 10.0000 mg | ORAL_TABLET | Freq: Every day | ORAL | 4 refills | Status: DC

## 2021-08-19 MED ORDER — CHLORTHALIDONE 25 MG PO TABS: 25.0000 mg | ORAL_TABLET | Freq: Every day | ORAL | 4 refills | Status: DC

## 2021-08-19 MED ORDER — LISINOPRIL 40 MG PO TABS: 40.0000 mg | ORAL_TABLET | Freq: Every day | ORAL | 4 refills | Status: DC

## 2021-08-19 MED ORDER — CELECOXIB 100 MG PO CAPS: 100.0000 mg | ORAL_CAPSULE | Freq: Every day | ORAL | 4 refills | Status: DC | PRN

## 2021-08-19 NOTE — Progress Notes (Signed)
? ?BP 130/80   Pulse 64   Temp 97.8 ?F (36.6 ?C) (Oral)   Ht 5\' 6"  (1.676 m)   Wt 187 lb (84.8 kg)   LMP  (LMP Unknown)   SpO2 99%   BMI 30.18 kg/m?   ? ?Subjective:  ? ? Patient ID: Allison Reynolds, female    DOB: 1960-12-15, 61 y.o.   MRN: [id number] ? ?HPI: ?Allison Reynolds is a 61 y.o. female presenting on 08/19/2021 for comprehensive medical examination. Current medical complaints include: none ? ?She currently lives with: husband ?Menopausal Symptoms: no ? ?HYPERTENSION / HYPERLIPIDEMIA ?Continues on Amlodipine, Hygroton, and Lisinopril daily. ?Satisfied with current treatment? yes ?Duration of hypertension: chronic ?BP monitoring frequency: not checking ?BP range:  ?BP medication side effects: no ?Past BP meds:  ?Duration of hyperlipidemia: chronic -- no supplements ?Aspirin: no ?Recent stressors: no ?Recurrent headaches: no ?Visual changes: no ?Palpitations: no ?Dyspnea: no ?Chest pain: no ?Lower extremity edema: no ?Dizzy/lightheaded: no  ?The ASCVD Risk score (Arnett DK, et al., 2019) failed to calculate for the following reasons: ?  Cannot find a previous HDL lab ?  Cannot find a previous total cholesterol lab  ? ?ARTHRALGIAS / JOINT ACHES ?For over 6 months both legs aching L>R.  Was told at urgent care she had bad arthritis in left knee + her fingers get stiff a lot as well R>L.  Was given Meloxicam/Baclofen/Prednisone, but this does not work well.  She is right hand dominant.  In past took Celebrex, which offered benefit.  Did have left knee imaging in 2021 which was negative. ?Duration: months ?Pain: yes ?Symmetric: yes ? 8/10 ?Quality: dull, aching, and throbbing ?Frequency: intermittent ?Context:  fluctuating ?Decreased function/range of motion: none ?Erythema: none ?Swelling: occasionally to hands ?Heat or warmth: none ?Morning stiffness: yes -- lasts <30 minutes and sometimes over one hour ?Aggravating factors: unknown ?Alleviating factors: hot bath and Celebrex ?Relief with NSAIDs?:  moderate ?Treatments attempted:  Celebrex, Meloxicam, Baclofen, and Prednisone  ?Involved Joints:  ?   Hands: yes bilateral ?   Wrists: yes bilateral  ?   Elbows: no  none ?   Shoulders: yes bilateral ?   Back: yes  ?   Hips: no  none ?   Knees: yes bilateral ?   Ankles: no  none ?   Feet: no  none   ? ?Depression Screen done today and results listed below:  ?Depression screen [facility] 08/19/2021 02/10/2019 06/15/2017 01/12/2017  ?Decreased Interest 0 0 0 0  ?Down, Depressed, Hopeless 0 0 0 2  ?PHQ - 2 Score 0 0 0 2  ?Altered sleeping - 1 1 3   ?Tired, decreased energy - 0 1 0  ?Change in appetite - 0 0 0  ?Feeling bad or failure about yourself  - 0 0 2  ?Trouble concentrating - 0 0 0  ?Moving slowly or fidgety/restless - 0 0 0  ?Suicidal thoughts - 0 0 1  ?PHQ-9 Score - 1 2 8   ?Difficult doing work/chores - Not difficult at all - -  ? ? ?Fall Risk 02/10/2019 08/24/2019 12/10/2019 12/15/2020 08/19/2021  ?Falls in the past year? 0 - - - 0  ?Was there an injury with Fall? 0 - - - 0  ?Fall Risk Category Calculator 0 - - - 0  ?Fall Risk Category Low - - - Low  ?Patient Fall Risk Level Low fall risk Low fall risk Low fall risk Low fall risk Low fall risk  ?Patient at Risk for Falls Due  to - - - - No Fall Risks  ?Fall risk Follow up Falls evaluation completed - - - Falls evaluation completed  ?  ?Functional Status Survey: ?Is the patient deaf or have difficulty hearing?: No ?Does the patient have difficulty seeing, even when wearing glasses/contacts?: No ?Does the patient have difficulty concentrating, remembering, or making decisions?: No ?Does the patient have difficulty walking or climbing stairs?: No ?Does the patient have difficulty dressing or bathing?: No ?Does the patient have difficulty doing errands alone such as visiting a doctor's office or shopping?: No  ? ?Past Medical History:  ?Past Medical History:  ?Diagnosis Date  ? Arthritis   ? Heart murmur   ? Hypertension   ? Thyroid disease   ? ? ?Surgical History:  ?Past  Surgical History:  ?Procedure Laterality Date  ? ABDOMINAL HYSTERECTOMY  age 23  ? BREAST BIOPSY Right 2016  ? benign  ? CESAREAN SECTION  1981, 1983  ? ? ?Medications:  ?No current outpatient medications on file prior to visit.  ? ?No current facility-administered medications on file prior to visit.  ? ? ?Allergies:  ?No Known Allergies ? ?Social History:  ?Social History  ? ?Socioeconomic History  ? Marital status: Single  ?  Spouse name: Not on file  ? Number of children: Not on file  ? Years of education: Not on file  ? Highest education level: Not on file  ?Occupational History  ? Not on file  ?Tobacco Use  ? Smoking status: Some Days  ?  Years: 2.00  ?  Types: Cigarettes  ? Smokeless tobacco: Never  ? Tobacco comments:  ?  1 pack per week  ?Vaping Use  ? Vaping Use: Never used  ?Substance and Sexual Activity  ? Alcohol use: Yes  ?  Comment: occasionally  ? Drug use: No  ? Sexual activity: Yes  ?Other Topics Concern  ? Not on file  ?Social History Narrative  ? Not on file  ? ?Social Determinants of Health  ? ?Financial Resource Strain: Not on file  ?Food Insecurity: Not on file  ?Transportation Needs: Not on file  ?Physical Activity: Not on file  ?Stress: Not on file  ?Social Connections: Not on file  ?Intimate Partner Violence: Not on file  ? ?Social History  ? ?Tobacco Use  ?Smoking Status Some Days  ? Years: 2.00  ? Types: Cigarettes  ?Smokeless Tobacco Never  ?Tobacco Comments  ? 1 pack per week  ? ?Social History  ? ?Substance and Sexual Activity  ?Alcohol Use Yes  ? Comment: occasionally  ? ? ?Family History:  ?Family History  ?Problem Relation Age of Onset  ? Hypertension Mother   ? Hypertension Father   ? Heart murmur Father   ? Hypertension Daughter   ? Lupus Paternal Aunt   ? ? ?Past medical history, surgical history, medications, allergies, family history and social history reviewed with patient today and changes made to appropriate areas of the chart.  ? ?ROS ?All other ROS negative except what is  listed above and in the HPI.  ? ?   ?Objective:  ?  ?BP 130/80   Pulse 64   Temp 97.8 ?F (36.6 ?C) (Oral)   Ht 5\' 6"  (1.676 m)   Wt 187 lb (84.8 kg)   LMP  (LMP Unknown)   SpO2 99%   BMI 30.18 kg/m?   ?Wt Readings from Last 3 Encounters:  ?08/19/21 187 lb (84.8 kg)  ?12/15/20 180 lb (81.6 kg)  ?01/30/20 193  lb 9.6 oz (87.8 kg)  ?  ?Physical Exam ?Vitals and nursing note reviewed. Exam conducted with a chaperone present.  ?Constitutional:   ?   General: She is awake. She is not in acute distress. ?   Appearance: She is well-developed. She is not ill-appearing.  ?HENT:  ?   Head: Normocephalic and atraumatic.  ?   Right Ear: Hearing, tympanic membrane, ear canal and external ear normal. No drainage.  ?   Left Ear: Hearing, tympanic membrane, ear canal and external ear normal. No drainage.  ?   Nose: Nose normal.  ?   Right Sinus: No maxillary sinus tenderness or frontal sinus tenderness.  ?   Left Sinus: No maxillary sinus tenderness or frontal sinus tenderness.  ?   Mouth/Throat:  ?   Mouth: Mucous membranes are moist.  ?   Pharynx: Oropharynx is clear. Uvula midline. No pharyngeal swelling, oropharyngeal exudate or posterior oropharyngeal erythema.  ?Eyes:  ?   General: Lids are normal.     ?   Right eye: No discharge.     ?   Left eye: No discharge.  ?   Extraocular Movements: Extraocular movements intact.  ?   Conjunctiva/sclera: Conjunctivae normal.  ?   Pupils: Pupils are equal, round, and reactive to light.  ?   Visual Fields: Right eye visual fields normal and left eye visual fields normal.  ?Neck:  ?   Thyroid: No thyromegaly.  ?   Vascular: No carotid bruit.  ?   Trachea: Trachea normal.  ?Cardiovascular:  ?   Rate and Rhythm: Normal rate and regular rhythm.  ?   Heart sounds: Murmur heard.  ?Systolic murmur is present with a grade of 2/6.  ?  No gallop.  ?Pulmonary:  ?   Effort: Pulmonary effort is normal. No accessory muscle usage or respiratory distress.  ?   Breath sounds: Normal breath sounds.   ?Chest:  ?Breasts: ?   Right: Normal.  ?   Left: Normal.  ?   Comments: Denser breast tissue palpated, R>L. ?Abdominal:  ?   General: Bowel sounds are normal.  ?   Palpations: Abdomen is soft. There is no hepatomegaly or sple

## 2021-08-19 NOTE — Assessment & Plan Note (Signed)
BMI 30.18  Recommended eating smaller high protein, low fat meals more frequently and exercising 30 mins a day 5 times a week with a goal of 10-15lb weight loss in the next 3 months. Patient voiced their understanding and motivation to adhere to these recommendations.  

## 2021-08-19 NOTE — Assessment & Plan Note (Signed)
Chronic, stable with no symptoms present and none auscultated today. ?

## 2021-08-19 NOTE — Assessment & Plan Note (Signed)
Chronic, ongoing with BP at goal today in office.  Recommend she monitor BP at least a few mornings a week at home and document.  DASH diet at home.  Continue current medication regimen and adjust as needed, refills sent in.  Labs today: CBC, CMP, TSH, urine ALB.  Return in 6 months. ? ?

## 2021-08-19 NOTE — Assessment & Plan Note (Signed)
To knees and hands, suspect more OA.  Will obtain imaging to further assess.  Celecoxib PRN sent in and aware to use only as needed, has worked well for her in past.  Will check labs today to include ANA, ESR, CRP.  Denies any tick bite history, however if today's labs stable may consider further testing in future.  Return in 8 weeks. ?

## 2021-08-19 NOTE — Assessment & Plan Note (Signed)
Noted on past labs, has missed follow-up.  No current medications, recheck lipid panel today and initiate medication as needed. ?

## 2021-08-19 NOTE — Assessment & Plan Note (Addendum)
Stable, asymptomatic.  Will obtain echo if symptoms present. ?

## 2021-08-20 ENCOUNTER — Encounter: Payer: Self-pay | Admitting: Nurse Practitioner

## 2021-08-20 ENCOUNTER — Other Ambulatory Visit: Payer: Self-pay | Admitting: Nurse Practitioner

## 2021-08-20 DIAGNOSIS — E559 Vitamin D deficiency, unspecified: Secondary | ICD-10-CM | POA: Insufficient documentation

## 2021-08-20 LAB — COMPREHENSIVE METABOLIC PANEL
ALT: 22 IU/L (ref 0–32)
AST: 26 IU/L (ref 0–40)
Albumin/Globulin Ratio: 2.1 (ref 1.2–2.2)
Albumin: 5.2 g/dL — ABNORMAL HIGH (ref 3.8–4.8)
Alkaline Phosphatase: 58 IU/L (ref 44–121)
BUN/Creatinine Ratio: 21 (ref 12–28)
BUN: 15 mg/dL (ref 8–27)
Bilirubin Total: 0.7 mg/dL (ref 0.0–1.2)
CO2: 26 mmol/L (ref 20–29)
Calcium: 9.8 mg/dL (ref 8.7–10.3)
Chloride: 92 mmol/L — ABNORMAL LOW (ref 96–106)
Creatinine, Ser: 0.72 mg/dL (ref 0.57–1.00)
Globulin, Total: 2.5 g/dL (ref 1.5–4.5)
Glucose: 92 mg/dL (ref 70–99)
Potassium: 3.5 mmol/L (ref 3.5–5.2)
Sodium: 135 mmol/L (ref 134–144)
Total Protein: 7.7 g/dL (ref 6.0–8.5)
eGFR: 94 mL/min/{1.73_m2} (ref >59)

## 2021-08-20 LAB — CBC WITH DIFFERENTIAL/PLATELET
Basophils Absolute: 0 10*3/uL (ref 0.0–0.2)
Basos: 1 %
EOS (ABSOLUTE): 0.1 10*3/uL (ref 0.0–0.4)
Eos: 1 %
Hematocrit: 41.2 % (ref 34.0–46.6)
Hemoglobin: 13.7 g/dL (ref 11.1–15.9)
Immature Grans (Abs): 0 10*3/uL (ref 0.0–0.1)
Immature Granulocytes: 0 %
Lymphocytes Absolute: 1.6 10*3/uL (ref 0.7–3.1)
Lymphs: 33 %
MCH: 27.6 pg (ref 26.6–33.0)
MCHC: 33.3 g/dL (ref 31.5–35.7)
MCV: 83 fL (ref 79–97)
Monocytes Absolute: 0.5 10*3/uL (ref 0.1–0.9)
Monocytes: 10 %
Neutrophils Absolute: 2.7 10*3/uL (ref 1.4–7.0)
Neutrophils: 55 %
Platelets: 255 10*3/uL (ref 150–450)
RBC: 4.97 x10E6/uL (ref 3.77–5.28)
RDW: 12.5 % (ref 11.7–15.4)
WBC: 4.9 10*3/uL (ref 3.4–10.8)

## 2021-08-20 LAB — LIPID PANEL W/O CHOL/HDL RATIO
Cholesterol, Total: 239 mg/dL — ABNORMAL HIGH (ref 100–199)
HDL: 81 mg/dL (ref >39)
LDL Chol Calc (NIH): 140 mg/dL — ABNORMAL HIGH (ref 0–99)
Triglycerides: 105 mg/dL (ref 0–149)
VLDL Cholesterol Cal: 18 mg/dL (ref 5–40)

## 2021-08-20 LAB — SEDIMENTATION RATE: Sed Rate: 13 mm/hr (ref 0–40)

## 2021-08-20 LAB — C-REACTIVE PROTEIN: CRP: 1 mg/L (ref 0–10)

## 2021-08-20 LAB — TSH: TSH: 1.01 u[IU]/mL (ref 0.450–4.500)

## 2021-08-20 LAB — VITAMIN D 25 HYDROXY (VIT D DEFICIENCY, FRACTURES): Vit D, 25-Hydroxy: 9.1 ng/mL — ABNORMAL LOW (ref 30.0–100.0)

## 2021-08-20 LAB — ANA W/REFLEX IF POSITIVE: Anti Nuclear Antibody (ANA): NEGATIVE

## 2021-08-20 MED ORDER — CHOLECALCIFEROL 1.25 MG (50000 UT) PO TABS: 1.0000 | ORAL_TABLET | ORAL | 4 refills | Status: DC

## 2021-08-20 MED ORDER — ROSUVASTATIN CALCIUM 10 MG PO TABS: 10.0000 mg | ORAL_TABLET | Freq: Every day | ORAL | 3 refills | Status: DC

## 2021-08-20 NOTE — Progress Notes (Signed)
Good morning crew, please let Maryjane know her labs have returned: ?- Kidney function, creatinine and eGFR, remains normal, as is liver function, AST and ALT.   ?- Cholesterol labs are elevated, I would recommend starting a statin to help lower levels and help prevent heart attack or stroke.  I am sending in Rosuvastatin 10 MG to take daily, we will then recheck labs next visit. ?- CBC shows no anemia or infection.  Inflammatory labs are all negative or normal. Thyroid lab is normal. ?- Vitamin D level is very low, I recommend starting a weekly higher dose Vitamin D supplement for bone health.  I have sent this in for your to start taking.  Any questions?   ?Keep being stellar!!  Thank you for allowing me to participate in your care.  I appreciate you. ?Kindest regards, ?Shailynn Fong ? ?

## 2021-08-26 ENCOUNTER — Ambulatory Visit
Admission: RE | Admit: 2021-08-26 | Discharge: 2021-08-26 | Disposition: A | Discharge status: Home / Self Care | Payer: Commercial Managed Care - HMO | Attending: Nurse Practitioner | Admitting: Nurse Practitioner

## 2021-08-26 ENCOUNTER — Ambulatory Visit
Admission: RE | Admit: 2021-08-26 | Discharge: 2021-08-26 | Disposition: A | Discharge status: Home / Self Care | Payer: Commercial Managed Care - HMO | Source: Ambulatory Visit | Attending: Nurse Practitioner | Admitting: Nurse Practitioner

## 2021-08-26 ENCOUNTER — Other Ambulatory Visit: Payer: Self-pay

## 2021-08-26 DIAGNOSIS — M2550 Pain in unspecified joint: Secondary | ICD-10-CM

## 2021-08-27 ENCOUNTER — Telehealth: Payer: Self-pay | Admitting: Nurse Practitioner

## 2021-08-27 NOTE — Telephone Encounter (Signed)
Copied from [place] [id number]. Topic: General - Other ?>> Aug 27, 2021  2:17 PM Loma Boston wrote: ?Reason for CRM: Pt states she was called back for X-ray results and seems to not have been sent to [facility] yet as it was not them that called, Pt would like a cb from office at  [phone] ?

## 2021-08-27 NOTE — Telephone Encounter (Signed)
See result note. One attempt has been made to reach patient already.  ?

## 2021-08-27 NOTE — Progress Notes (Signed)
Good morning please let Ninamarie know her imagine has returned: Overall left hand is stable, right hand has some mild degenerative changes (arthritis), and left knee has some mild arthritis present.  Use Celecoxib as needed and if any worsening discomfort let me know.  We may need to consider physical therapy in future.  Any questions? ?Keep being stellar!!  Thank you for allowing me to participate in your care.  I appreciate you. ?Kindest regards, ?Damonta Cossey ?

## 2021-08-28 ENCOUNTER — Telehealth: Payer: Self-pay

## 2021-08-28 NOTE — Telephone Encounter (Signed)
Pt given lab results per notes of Jolene, NP on 08/28/21. Pt verbalized understanding. Pt states she is taking the Celebrex and hasn't helped much yet. I advised her she can take tylenol or ibuprofen to help with pain as well.  ?

## 2021-08-30 NOTE — Patient Instructions (Incomplete)
Vitamin D Deficiency Vitamin D deficiency is when your body does not have enough vitamin D. Vitamin D is important to your body because: It helps your body use other minerals. It helps to keep your bones strong and healthy. It may help to prevent some diseases. It helps your heart and other muscles work well. Not getting enough vitamin D can make your bones soft. It can also cause other health problems. What are the causes? This condition may be caused by: Not eating enough foods that contain vitamin D. Not getting enough sun. Having diseases that make it hard for your body to absorb vitamin D. Having a surgery in which a part of the stomach or a part of the small intestine is removed. Having kidney disease or liver disease. What increases the risk? You are more likely to get this condition if: You are older. You do not spend much time outdoors. You live in a nursing home. You have had broken bones. You have weak or thin bones (osteoporosis). You have a disease or condition that changes how your body absorbs vitamin D. You have dark skin. You take certain medicines. You are overweight or obese. What are the signs or symptoms? In mild cases, there may not be any symptoms. If the condition is very bad, symptoms may include: Bone pain. Muscle pain. Falling often. Broken bones caused by a minor injury. How is this treated? Treatment may include taking supplements as told by your doctor. Your doctor will tell you what dose is best for you. Supplements may include: Vitamin D. Calcium. Follow these instructions at home: Eating and drinking  Eat foods that contain vitamin D, such as: Dairy products, cereals, or juices with added vitamin D. Check the label. Fish, such as salmon or trout. Eggs. Oysters. Mushrooms. The items listed above may not be a complete list of what you can eat and drink. Contact a dietitian for more options. General instructions Take medicines and  supplements only as told by your doctor. Get regular, safe exposure to natural sunlight. Do not use a tanning bed. Maintain a healthy weight. Lose weight if needed. Keep all follow-up visits as told by your doctor. This is important. How is this prevented? You can get vitamin D by: Eating foods that naturally contain vitamin D. Eating or drinking products that have vitamin D added to them, such as cereals, juices, and milk. Taking vitamin D or a multivitamin that contains vitamin D. Being in the sun. Your body makes vitamin D when your skin is exposed to sunlight. Your body changes the sunlight into a form of the vitamin that it can use. Contact a doctor if: Your symptoms do not go away. You feel sick to your stomach (nauseous). You throw up (vomit). You poop less often than normal, or you have trouble pooping (constipation). Summary Vitamin D deficiency is when your body does not have enough vitamin D. Vitamin D helps to keep your bones strong and healthy. This condition is often treated by taking a supplement. Your doctor will tell you what dose is best for you. This information is not intended to replace advice given to you by your health care provider. Make sure you discuss any questions you have with your health care provider. Document Revised: 01/24/2018 Document Reviewed: 01/24/2018 Elsevier Patient Education  2022 Elsevier Inc.  

## 2021-09-04 ENCOUNTER — Ambulatory Visit: Payer: Managed Care, Other (non HMO) | Admitting: Nurse Practitioner

## 2021-09-18 ENCOUNTER — Ambulatory Visit: Payer: Managed Care, Other (non HMO) | Admitting: Nurse Practitioner

## 2021-09-20 NOTE — Patient Instructions (Signed)
Osteoarthritis  Osteoarthritis is a type of arthritis. It refers to joint pain or joint disease. Osteoarthritis affects tissue that covers the ends of bones in joints (cartilage). Cartilage acts as a cushion between the bones and helps them move smoothly. Osteoarthritis occurs when cartilage in the joints gets worn down. Osteoarthritis is sometimes called "wear and tear" arthritis. Osteoarthritis is the most common form of arthritis. It often occurs in older people. It is a condition that gets worse over time. The joints most often affected by this condition are in the fingers, toes, hips, knees, and spine, including the neck and lower back. What are the causes? This condition is caused by the wearing down of cartilage that covers the ends of bones. What increases the risk? The following factors may make you more likely to develop this condition: Being age 50 or older. Obesity. Overuse of joints. Past injury of a joint. Past surgery on a joint. Family history of osteoarthritis. What are the signs or symptoms? The main symptoms of this condition are pain, swelling, and stiffness in the joint. Other symptoms may include: An enlarged joint. More pain and further damage caused by small pieces of bone or cartilage that break off and float inside of the joint. Small deposits of bone (osteophytes) that grow on the edges of the joint. A grating or scraping feeling inside the joint when you move it. Popping or creaking sounds when you move. Difficulty walking or exercising. An inability to grip items, twist your hand(s), or control the movements of your hands and fingers. How is this diagnosed? This condition may be diagnosed based on: Your medical history. A physical exam. Your symptoms. X-rays of the affected joint(s). Blood tests to rule out other types of arthritis. How is this treated? There is no cure for this condition, but treatment can help control pain and improve joint function.  Treatment may include a combination of therapies, such as: Pain relief techniques, such as: Applying heat and cold to the joint. Massage. A form of talk therapy called cognitive behavioral therapy (CBT). This therapy helps you set goals and follow up on the changes that you make. Medicines for pain and inflammation. The medicines can be taken by mouth or applied to the skin. They include: NSAIDs, such as ibuprofen. Prescription medicines. Strong anti-inflammatory medicines (corticosteroids). Certain nutritional supplements. A prescribed exercise program. You may work with a physical therapist. Assistive devices, such as a brace, wrap, splint, specialized glove, or cane. A weight control plan. Surgery, such as: An osteotomy. This is done to reposition the bones and relieve pain or to remove loose pieces of bone and cartilage. Joint replacement surgery. You may need this surgery if you have advanced osteoarthritis. Follow these instructions at home: Activity Rest your affected joints as told by your health care provider. Exercise as told by your health care provider. He or she may recommend specific types of exercise, such as: Strengthening exercises. These are done to strengthen the muscles that support joints affected by arthritis. Aerobic activities. These are exercises, such as brisk walking or water aerobics, that increase your heart rate. Range-of-motion activities. These help your joints move more easily. Balance and agility exercises. Managing pain, stiffness, and swelling     If directed, apply heat to the affected area as often as told by your health care provider. Use the heat source that your health care provider recommends, such as a moist heat pack or a heating pad. If you have a removable assistive device, remove it   as told by your health care provider. Place a towel between your skin and the heat source. If your health care provider tells you to keep the assistive device  on while you apply heat, place a towel between the assistive device and the heat source. Leave the heat on for 20-30 minutes. Remove the heat if your skin turns bright red. This is especially important if you are unable to feel pain, heat, or cold. You may have a greater risk of getting burned. If directed, put ice on the affected area. To do this: If you have a removable assistive device, remove it as told by your health care provider. Put ice in a plastic bag. Place a towel between your skin and the bag. If your health care provider tells you to keep the assistive device on during icing, place a towel between the assistive device and the bag. Leave the ice on for 20 minutes, 2-3 times a day. Move your fingers or toes often to reduce stiffness and swelling. Raise (elevate) the injured area above the level of your heart while you are sitting or lying down. General instructions Take over-the-counter and prescription medicines only as told by your health care provider. Maintain a healthy weight. Follow instructions from your health care provider for weight control. Do not use any products that contain nicotine or tobacco, such as cigarettes, e-cigarettes, and chewing tobacco. If you need help quitting, ask your health care provider. Use assistive devices as told by your health care provider. Keep all follow-up visits as told by your health care provider. This is important. Where to find more information National Institute of Arthritis and Musculoskeletal and Skin Diseases: www.niams.nih.gov National Institute on Aging: www.nia.nih.gov American College of Rheumatology: www.rheumatology.org Contact a health care provider if: You have redness, swelling, or a feeling of warmth in a joint that gets worse. You have a fever along with joint or muscle aches. You develop a rash. You have trouble doing your normal activities. Get help right away if: You have pain that gets worse and is not relieved by  pain medicine. Summary Osteoarthritis is a type of arthritis that affects tissue covering the ends of bones in joints (cartilage). This condition is caused by the wearing down of cartilage that covers the ends of bones. The main symptom of this condition is pain, swelling, and stiffness in the joint. There is no cure for this condition, but treatment can help control pain and improve joint function. This information is not intended to replace advice given to you by your health care provider. Make sure you discuss any questions you have with your health care provider. Document Revised: 05/15/2019 Document Reviewed: 05/15/2019 Elsevier Patient Education  2023 Elsevier Inc.  

## 2021-09-23 ENCOUNTER — Telehealth: Payer: Self-pay

## 2021-09-23 ENCOUNTER — Ambulatory Visit (INDEPENDENT_AMBULATORY_CARE_PROVIDER_SITE_OTHER): Payer: Managed Care, Other (non HMO) | Admitting: Nurse Practitioner

## 2021-09-23 ENCOUNTER — Encounter: Payer: Self-pay | Admitting: Nurse Practitioner

## 2021-09-23 DIAGNOSIS — M2550 Pain in unspecified joint: Secondary | ICD-10-CM | POA: Diagnosis not present

## 2021-09-23 NOTE — Telephone Encounter (Signed)
-----  Message from Venita Lick, NP sent at 09/23/2021  9:19 AM EDT ----- ?Can you reach out to [facility] and see if Cologuard is covered, she has kit at home and is unsure if covered.  Let her know:) ?

## 2021-09-23 NOTE — Assessment & Plan Note (Signed)
To knees and hands, suspect more OA.  Improved at this time since starting Tylenol and Celebrex as needed, not using daily -- recommend continue this regimen and may use Voltaren gel as needed.  Adjust as needed and consider PT in future. ?

## 2021-09-23 NOTE — Telephone Encounter (Signed)
Left a message for patient notifying her that the Cologuard order is covered by her current insurance [employer]. Advised patient to give our office if she need any help regarding her Cologuard process.  ?

## 2021-09-23 NOTE — Progress Notes (Signed)
? ?BP 138/74 (BP Location: Left Arm, Patient Position: Sitting, Cuff Size: Large)   Pulse 64   Temp 98.1 ?F (36.7 ?C) (Oral)   Ht _0  (1.676 m)   Wt 187 lb (84.8 kg)   LMP  (LMP Unknown)   SpO2 98%   BMI 30.18 kg/m?   ? ?Subjective:  ? ? Patient ID: Allison Reynolds, female    DOB: 04/06/1961, 61 y.o.   MRN: [id number] ? ?HPI: ?Allison Reynolds is a 61 y.o. female ? ?Chief Complaint  ?Patient presents with  ? Arthritis  ?  Patient is here to follow up on Arthritis Pain. Patient states she is doing very well and says the Celebrex and Tylenol Arthritis together has helped her a lot. Patient denies having any concerns at today's visit.   ? ?ARTHRALGIAS / JOINT ACHES ?Follow-up for joint pain today, at this time pain is improved utilizing Celebrex and Tylenol only as needed.  Recent labs noted low Vitamin D, is taking supplement.  Imaging did noticed some arthritis to hands and left knee.   ? ?For over 6 months both legs aching L>R.  She is right hand dominant.   ?Duration: months ?Pain: yes ?Symmetric: yes ? 8/10 ?Quality: dull, aching, and throbbing ?Frequency: intermittent ?Context:  fluctuating ?Decreased function/range of motion: none ?Erythema: none ?Swelling: occasionally to hands ?Heat or warmth: none ?Morning stiffness: yes -- lasts <30 minutes and sometimes over one hour ?Aggravating factors: unknown ?Alleviating factors: hot bath and Celebrex ?Relief with NSAIDs?: moderate ?Treatments attempted:  Celebrex, Meloxicam, Baclofen, and Prednisone  ?Involved Joints:  ?   Hands: yes bilateral ?   Wrists: yes bilateral  ?   Elbows: no  none ?   Shoulders: yes bilateral ?   Back: yes  ?   Hips: no  none ?   Knees: yes bilateral ?   Ankles: no  none ?   Feet: no  none   ? ?Relevant past medical, surgical, family and social history reviewed and updated as indicated. Interim medical history since our last visit reviewed. ?Allergies and medications reviewed and updated. ? ?Review of Systems  ?Constitutional:   Negative for activity change, appetite change, diaphoresis, fatigue and fever.  ?Respiratory:  Negative for cough, chest tightness and shortness of breath.   ?Cardiovascular:  Negative for chest pain, palpitations and leg swelling.  ?Gastrointestinal: Negative.   ?Musculoskeletal:  Positive for arthralgias.  ?Neurological: Negative.   ?Psychiatric/Behavioral: Negative.    ? ?Per HPI unless specifically indicated above ? ?   ?Objective:  ?  ?BP 138/74 (BP Location: Left Arm, Patient Position: Sitting, Cuff Size: Large)   Pulse 64   Temp 98.1 ?F (36.7 ?C) (Oral)   Ht _1  (1.676 m)   Wt 187 lb (84.8 kg)   LMP  (LMP Unknown)   SpO2 98%   BMI 30.18 kg/m?   ?Wt Readings from Last 3 Encounters:  ?09/23/21 187 lb (84.8 kg)  ?08/19/21 187 lb (84.8 kg)  ?12/15/20 180 lb (81.6 kg)  ?  ?Physical Exam ?Vitals and nursing note reviewed.  ?Constitutional:   ?   General: She is awake.  ?   Appearance: She is well-developed.  ?HENT:  ?   Head: Normocephalic.  ?   Right Ear: Hearing normal.  ?   Left Ear: Hearing normal.  ?   Nose: Nose normal.  ?   Mouth/Throat:  ?   Mouth: Mucous membranes are moist.  ?Eyes:  ?   General: Lids are normal.     ?  Right eye: No discharge.     ?   Left eye: No discharge.  ?   Conjunctiva/sclera: Conjunctivae normal.  ?   Pupils: Pupils are equal, round, and reactive to light.  ?Neck:  ?   Thyroid: No thyromegaly.  ?   Vascular: No carotid bruit or JVD.  ?Cardiovascular:  ?   Rate and Rhythm: Normal rate and regular rhythm.  ?   Heart sounds: Murmur heard.  ?Systolic murmur is present with a grade of 2/6.  ?  No gallop.  ?Pulmonary:  ?   Effort: Pulmonary effort is normal.  ?   Breath sounds: Normal breath sounds.  ?Abdominal:  ?   General: Bowel sounds are normal.  ?   Palpations: Abdomen is soft. There is no hepatomegaly or splenomegaly.  ?Musculoskeletal:  ?   Cervical back: Normal range of motion and neck supple.  ?   Right lower leg: No edema.  ?   Left lower leg: No edema.   ?Lymphadenopathy:  ?   Cervical: No cervical adenopathy.  ?Skin: ?   General: Skin is warm and dry.  ?Neurological:  ?   Mental Status: She is alert and oriented to person, place, and time.  ?Psychiatric:     ?   Attention and Perception: Attention normal.     ?   Mood and Affect: Mood normal.     ?   Behavior: Behavior normal. Behavior is cooperative.     ?   Thought Content: Thought content normal.     ?   Judgment: Judgment normal.  ? ? ?Results for orders placed or performed in visit on 08/19/21  ?Microalbumin, Urine Waived  ?Result Value Ref Range  ? Microalb, Ur Waived 10 0 - 19 mg/L  ? Creatinine, Urine Waived 10 10 - 300 mg/dL  ? Microalb/Creat Ratio <30 <30 mg/g  ?CBC with Differential/Platelet  ?Result Value Ref Range  ? WBC 4.9 3.4 - 10.8 x10E3/uL  ? RBC 4.97 3.77 - 5.28 x10E6/uL  ? Hemoglobin 13.7 11.1 - 15.9 g/dL  ? Hematocrit 41.2 34.0 - 46.6 %  ? MCV 83 79 - 97 fL  ? MCH 27.6 26.6 - 33.0 pg  ? MCHC 33.3 31.5 - 35.7 g/dL  ? RDW 12.5 11.7 - 15.4 %  ? Platelets 255 150 - 450 x10E3/uL  ? Neutrophils 55 Not Estab. %  ? Lymphs 33 Not Estab. %  ? Monocytes 10 Not Estab. %  ? Eos 1 Not Estab. %  ? Basos 1 Not Estab. %  ? Neutrophils Absolute 2.7 1.4 - 7.0 x10E3/uL  ? Lymphocytes Absolute 1.6 0.7 - 3.1 x10E3/uL  ? Monocytes Absolute 0.5 0.1 - 0.9 x10E3/uL  ? EOS (ABSOLUTE) 0.1 0.0 - 0.4 x10E3/uL  ? Basophils Absolute 0.0 0.0 - 0.2 x10E3/uL  ? Immature Granulocytes 0 Not Estab. %  ? Immature Grans (Abs) 0.0 0.0 - 0.1 x10E3/uL  ?Comprehensive metabolic panel  ?Result Value Ref Range  ? Glucose 92 70 - 99 mg/dL  ? BUN 15 8 - 27 mg/dL  ? Creatinine, Ser 0.72 0.57 - 1.00 mg/dL  ? eGFR 94 >59 mL/min/1.73  ? BUN/Creatinine Ratio 21 12 - 28  ? Sodium 135 134 - 144 mmol/L  ? Potassium 3.5 3.5 - 5.2 mmol/L  ? Chloride 92 (L) 96 - 106 mmol/L  ? CO2 26 20 - 29 mmol/L  ? Calcium 9.8 8.7 - 10.3 mg/dL  ? Total Protein 7.7 6.0 - 8.5 g/dL  ? Albumin 5.2 (  H) 3.8 - 4.8 g/dL  ? Globulin, Total 2.5 1.5 - 4.5 g/dL  ?  Albumin/Globulin Ratio 2.1 1.2 - 2.2  ? Bilirubin Total 0.7 0.0 - 1.2 mg/dL  ? Alkaline Phosphatase 58 44 - 121 IU/L  ? AST 26 0 - 40 IU/L  ? ALT 22 0 - 32 IU/L  ?Lipid Panel w/o Chol/HDL Ratio  ?Result Value Ref Range  ? Cholesterol, Total 239 (H) 100 - 199 mg/dL  ? Triglycerides 105 0 - 149 mg/dL  ? HDL 81 >39 mg/dL  ? VLDL Cholesterol Cal 18 5 - 40 mg/dL  ? LDL Chol Calc (NIH) 140 (H) 0 - 99 mg/dL  ?TSH  ?Result Value Ref Range  ? TSH 1.010 0.450 - 4.500 uIU/mL  ?VITAMIN D 25 Hydroxy (Vit-D Deficiency, Fractures)  ?Result Value Ref Range  ? Vit D, 25-Hydroxy 9.1 (L) 30.0 - 100.0 ng/mL  ?ANA w/Reflex if Positive  ?Result Value Ref Range  ? Anti Nuclear Antibody (ANA) Negative Negative  ?C-reactive protein  ?Result Value Ref Range  ? CRP 1 0 - 10 mg/L  ?Sed Rate (ESR)  ?Result Value Ref Range  ? Sed Rate 13 0 - 40 mm/hr  ? ?   ?Assessment & Plan:  ? ?Problem List Items Addressed This Visit   ? ?  ? Other  ? Multiple joint pain  ?  To knees and hands, suspect more OA.  Improved at this time since starting Tylenol and Celebrex as needed, not using daily -- recommend continue this regimen and may use Voltaren gel as needed.  Adjust as needed and consider PT in future. ? ?  ?  ?  ? ?Follow up plan: ?Return in about 5 months (around 02/23/2022) for HTN/HLD, ARTHRITIS, VIT D,. ? ? ? ? ? ?

## 2021-10-23 ENCOUNTER — Ambulatory Visit: Payer: Commercial Managed Care - HMO

## 2021-11-18 ENCOUNTER — Ambulatory Visit
Admission: RE | Admit: 2021-11-18 | Discharge: 2021-11-18 | Disposition: A | Discharge status: Home / Self Care | Payer: Commercial Managed Care - HMO | Source: Ambulatory Visit | Attending: Nurse Practitioner | Admitting: Nurse Practitioner

## 2021-11-18 DIAGNOSIS — Z1231 Encounter for screening mammogram for malignant neoplasm of breast: Secondary | ICD-10-CM | POA: Insufficient documentation

## 2021-11-19 NOTE — Progress Notes (Signed)
Please let Ruthy know her mammogram was normal:)

## 2021-12-17 ENCOUNTER — Ambulatory Visit: Payer: Self-pay

## 2021-12-17 NOTE — Telephone Encounter (Signed)
     Chief Complaint: Injury to right arm. Declines OV. "I just to have an xray done." Symptoms: Pain, decreased movement Frequency: 2 weeks ago Pertinent Negatives: Patient denies  Disposition: [] ED /[] Urgent Care (no appt availability in office) / [] Appointment(In office/virtual)/ []  [facility]/ [] Home Care/ [] Refused Recommended Disposition /[] [facility]/ [x]  Follow-up with PCP Additional Notes: Declines OV.Wants to speak with someone in the practice.  Reason for Disposition  [1] High-risk adult (e.g., age > 60 years, osteoporosis, chronic steroid use) AND [2] MILD to MODERATE pain  Answer Assessment - Initial Assessment Questions 1. MECHANISM: "How did the injury happen?"     Lifting and her a pop 2. ONSET: "When did the injury happen?" (Minutes or hours ago)      2 weeks ago 3. LOCATION: "Where is the injury located?" "Which arm?"     Right arm 4. APPEARANCE of INJURY: "What does the injury look like?"      Looks normal 5. SEVERITY: "Can you use the arm normally?"      No 6. SWELLING or BRUISING: "is there any swelling or bruising?" If Yes, ask: "How large is it? (e.g., inches, centimeters)      None 7. PAIN: "Is there pain?" If Yes, ask: "How bad is the pain?"    (Scale 1-10; or mild, moderate, severe)   - NONE (0): No pain.   - MILD (1-3): Doesn't interfere with normal activities.   - MODERATE (4-7): Interferes with normal activities (e.g., work or school) or awakens from sleep.   - SEVERE (8-10): Excruciating pain, unable to do any normal activities, unable to hold a cup of water.     Sometimes a 10 8. TETANUS: For any breaks in the skin, ask: "When was the last tetanus booster?"     Unsure 9. OTHER SYMPTOMS: "Do you have any other symptoms?"  (e.g., numbness in hand)     Difficulty moving arm 10. PREGNANCY: "Is there any chance you are pregnant?" "When was your last menstrual period?"       No  Protocols used: Arm Injury-A-AH

## 2021-12-17 NOTE — Telephone Encounter (Signed)
Called and spoke to patient. Advised the patient that we do not order x-rays or medications without an office visit. Patient states she understands and is planning to go to UC in the morning to have her arm looked at and x-rayed.

## 2021-12-18 ENCOUNTER — Ambulatory Visit
Admission: EM | Admit: 2021-12-18 | Discharge: 2021-12-18 | Disposition: A | Discharge status: Home / Self Care | Payer: Commercial Managed Care - HMO | Attending: Emergency Medicine | Admitting: Emergency Medicine

## 2021-12-18 ENCOUNTER — Ambulatory Visit (INDEPENDENT_AMBULATORY_CARE_PROVIDER_SITE_OTHER): Payer: Commercial Managed Care - HMO

## 2021-12-18 DIAGNOSIS — M25511 Pain in right shoulder: Secondary | ICD-10-CM | POA: Diagnosis not present

## 2021-12-18 DIAGNOSIS — S46911A Strain of unspecified muscle, fascia and tendon at shoulder and upper arm level, right arm, initial encounter: Secondary | ICD-10-CM

## 2021-12-18 MED ORDER — METHYLPREDNISOLONE 4 MG PO TBPK: ORAL_TABLET | ORAL | 0 refills | Status: DC

## 2021-12-18 NOTE — Discharge Instructions (Addendum)
Your tray did not demonstrate any evidence of fractures or dislocations.  You do have significant arthritis to your acromion process.  This is located in the front of your shoulder where you are having pain when I pressed on your joint.  Take the Medrol Dosepak went to the package instructions.  Do not take your Celebrex, ibuprofen, aspirin, or Voltaren gel while you are taking the Medrol Dosepak.  You may resume it once you have finished.  If your pain does not improve, or worsens, you need to see orthopedics.

## 2021-12-18 NOTE — ED Triage Notes (Signed)
Patient presents to [facility] for right shoulder pain. Patient reports that she was moving a wet rug about 2 - 3 week ago and when she was moving it and lifting it into the trash can she heard a pop.

## 2021-12-18 NOTE — ED Provider Notes (Signed)
[facility]    CSN: [id number] Arrival date & time: 12/18/21  0808      History   Chief Complaint Chief Complaint  Patient presents with   Shoulder Pain    Right    HPI Allison Reynolds is a 61 y.o. female.   HPI  61 year old female here for evaluation of right shoulder pain.  Patient reports that she has been experiencing pain and her right shoulder for last 2 to 3 weeks since lifting a heavy wet rug up into a trash can.  She states that when she was lifting up she heard a pop.  When asked to localize where her pain is she is pointing to more her tricep and middle bicep area.  She states that she has been using Tylenol and ibuprofen without any improvement of symptoms.  She also takes Celebrex for osteoarthritis.  She endorses some numbness and tingling in her fingers but denies any weakness in her grip.  She states this happened at home and not as a result of work.  Past Medical History:  Diagnosis Date   Arthritis    Heart murmur    Hypertension    Thyroid disease     Patient Active Problem List   Diagnosis Date Noted   Vitamin D deficiency 08/20/2021   Multiple joint pain 08/19/2021   Obesity 01/30/2020   PVC (premature ventricular contraction) 06/15/2017   Hyperlipidemia 02/23/2017   Systolic murmur 02/23/2017   Hypertension 01/12/2017    Past Surgical History:  Procedure Laterality Date   ABDOMINAL HYSTERECTOMY  age 50   BREAST BIOPSY Right 2016   benign   CESAREAN SECTION  1981, 1983    OB History     Gravida  3   Para  3   Term      Preterm      AB  0   Living  2      SAB  0   IAB      Ectopic      Multiple      Live Births           Obstetric Comments  Age first menstrual period 36 Age first pregnancy 65          Home Medications    Prior to Admission medications   Medication Sig Start Date End Date Taking? Authorizing Provider  amLODipine (NORVASC) 10 MG tablet Take 1 tablet (10 mg total) by mouth  daily. 08/19/21  Yes Cannady, Jolene T, NP  celecoxib (CELEBREX) 100 MG capsule Take 1 capsule (100 mg total) by mouth daily as needed. 08/19/21  Yes Cannady, Jolene T, NP  chlorthalidone (HYGROTON) 25 MG tablet Take 1 tablet (25 mg total) by mouth daily. 08/19/21  Yes Cannady, Jolene T, NP  Cholecalciferol 1.25 MG (50000 UT) TABS Take 1 tablet by mouth once a week. 08/20/21  Yes Cannady, Jolene T, NP  lisinopril (ZESTRIL) 40 MG tablet Take 1 tablet (40 mg total) by mouth daily. 08/19/21  Yes Cannady, Jolene T, NP  methylPREDNISolone (MEDROL DOSEPAK) 4 MG TBPK tablet Take according to the package insert. 12/18/21  Yes Becky Augusta, NP  rosuvastatin (CRESTOR) 10 MG tablet Take 1 tablet (10 mg total) by mouth daily. 08/20/21  Yes Marjie Skiff, NP    Family History Family History  Problem Relation Age of Onset   Hypertension Mother    Hypertension Father    Heart murmur Father    Hypertension Daughter    Lupus Paternal Aunt  Social History Social History   Tobacco Use   Smoking status: Some Days    Years: 2.00    Types: Cigarettes   Smokeless tobacco: Never   Tobacco comments:    1 pack per week  Vaping Use   Vaping Use: Never used  Substance Use Topics   Alcohol use: Yes    Comment: occasionally   Drug use: No     Allergies   Patient has no known allergies.   Review of Systems Review of Systems  Constitutional:  Negative for fever.  Musculoskeletal:  Positive for myalgias. Negative for arthralgias and joint swelling.  Neurological:  Positive for numbness. Negative for weakness.  Hematological: Negative.   Psychiatric/Behavioral: Negative.       Physical Exam Triage Vital Signs ED Triage Vitals  Enc Vitals Group     BP 12/18/21 0821 (S) (!) 174/91     Pulse Rate 12/18/21 0821 75     Resp --      Temp 12/18/21 0821 98.1 F (36.7 C)     Temp Source 12/18/21 0821 Oral     SpO2 12/18/21 0821 99 %     Weight 12/18/21 0819 185 lb (83.9 kg)     Height 12/18/21  0819 5\' 6"  (1.676 m)     Head Circumference --      Peak Flow --      Pain Score 12/18/21 0819 8     Pain Loc --      Pain Edu? --      Excl. in GC? --    No data found.  Updated Vital Signs BP (S) (!) 174/91 (BP Location: Left Arm)   Pulse 75   Temp 98.1 F (36.7 C) (Oral)   Ht 5\' 6"  (1.676 m)   Wt 185 lb (83.9 kg)   LMP  (LMP Unknown)   SpO2 99%   BMI 29.86 kg/m   Visual Acuity Right Eye Distance:   Left Eye Distance:   Bilateral Distance:    Right Eye Near:   Left Eye Near:    Bilateral Near:     Physical Exam Vitals and nursing note reviewed.  Constitutional:      Appearance: Normal appearance. She is not ill-appearing.  HENT:     Head: Normocephalic and atraumatic.  Musculoskeletal:        General: Tenderness and signs of injury present. No swelling or deformity. Normal range of motion.  Skin:    General: Skin is warm and dry.     Capillary Refill: Capillary refill takes less than 2 seconds.     Findings: No bruising or erythema.  Neurological:     General: No focal deficit present.     Mental Status: She is alert and oriented to person, place, and time.     Sensory: No sensory deficit.     Motor: No weakness.  Psychiatric:        Mood and Affect: Mood normal.        Behavior: Behavior normal.        Thought Content: Thought content normal.        Judgment: Judgment normal.      UC Treatments / Results  Labs (all labs ordered are listed, but only abnormal results are displayed) Labs Reviewed - No data to display  EKG   Radiology DG Shoulder Right  Result Date: 12/18/2021 CLINICAL DATA:  Pain anterior aspect for 2-3 weeks heard a pop after lifting heavy object. EXAM: RIGHT SHOULDER - 2+  VIEW COMPARISON:  None Available. FINDINGS: There is no evidence of fracture or dislocation. Mild acromioclavicular osteoarthritis. Soft tissues are unremarkable. IMPRESSION: No evidence of fracture or dislocation. Electronically Signed   By: Larose Hires D.O.    On: 12/18/2021 08:45    Procedures Procedures (including critical care time)  Medications Ordered in UC Medications - No data to display  Initial Impression / Assessment and Plan / UC Course  I have reviewed the triage vital signs and the nursing notes.  Pertinent labs & imaging results that were available during my care of the patient were reviewed by me and considered in my medical decision making (see chart for details).  Patient is a nontoxic-appearing 61 year old female with a history of hypertension, hyperlipidemia, PVCs, systolic murmur, obesity, multiple joint pain, and vitamin D deficiency presenting for evaluation of pain in her right upper arm.  The pain has been present for last 2 to 3 weeks and occurred as a result of lifting a heavy object into a trash can.  The heavy object was a wet rug.  She states that she heard a pop and she has been experiencing pain in her arm since then.  She also endorses intermittent numbness and tingling in her fingers but denies any weakness in her grip.  She is prescribed Celebrex for her osteoarthritis and she reports that this is not helping her pain.  She has been taking over-the-counter Tylenol and ibuprofen as well as applying Voltaren gel without any improvement of symptoms as well.  She has not contacted her PCP about this.  She states that she would like an x-ray of her shoulder.  On exam patient's right shoulder is in normal anatomical alignment.  Where she indicates she is having pain is at the insertion of the deltoid muscle radiating around to both the bicep and the tricep.  I cannot reproduce the pain with palpation.  I do not feel any muscle deficit.  Patient has 5/5 grip strength in her right hand as well as 5/5 upper extremity strength.  She states that resisted flexion and extension of her arm triggers the pain.  Her cap refill is less than 2 seconds and her peripheral radial and ulnar pulses are 2+.  She does have a rotator cuff injury in her  right shoulder but she is not currently being followed by orthopedics.  I will obtain radiograph of right shoulder but I suspect the patient's issue is coming from soft tissue injury.  When I put her through passive range of motion she complains of pain with movement in all directions.  There is no crepitus, clicking, or popping palpable of the glenohumeral joint with passive range of motion.  Shoulder x-ray independently reviewed and evaluated by me.  Impression: There is no evidence of dislocation or fracture.  There is some degenerative changes to the acromion process.  Tissues are unremarkable.  Radiology overread is pending. Radiology overread states no evidence of fracture or dislocation.  Mild acromioclavicular osteoarthritis.  Soft tissues unremarkable.  I will discharge patient home with a diagnosis of shoulder strain and have her hold her Celebrex.  I will start her on a short course of methylprednisolone.  If her symptoms do not improve she needs to follow-up with orthopedics.   Final Clinical Impressions(s) / UC Diagnoses   Final diagnoses:  Strain of right shoulder, initial encounter     Discharge Instructions      Your tray did not demonstrate any evidence of fractures or dislocations.  You do have significant arthritis to your acromion process.  This is located in the front of your shoulder where you are having pain when I pressed on your joint.  Take the Medrol Dosepak went to the package instructions.  Do not take your Celebrex, ibuprofen, aspirin, or Voltaren gel while you are taking the Medrol Dosepak.  You may resume it once you have finished.  If your pain does not improve, or worsens, you need to see orthopedics.     ED Prescriptions     Medication Sig Dispense Auth. Provider   methylPREDNISolone (MEDROL DOSEPAK) 4 MG TBPK tablet Take according to the package insert. 1 each Becky Augusta, NP      PDMP not reviewed this encounter.   Becky Augusta, NP 12/18/21  [phone]

## 2022-01-22 ENCOUNTER — Ambulatory Visit: Payer: Self-pay

## 2022-01-22 NOTE — Telephone Encounter (Signed)
Called patient to offer earlier appointments. If she calls back please schedule her with any provider that has an opening

## 2022-01-22 NOTE — Telephone Encounter (Signed)
  Chief Complaint: right upper arm and shoulder pain Symptoms: intermittent right chest near shoulder pain pt stated "feels different", throbbing shoulder pain that is severe, sweating without exertion, interfering with sleep Frequency: over a month Pertinent Negatives: Patient denies neck pain, swelling, rash, fever Disposition: [] ED /[] Urgent Care (no appt availability in office) / [] Appointment(In office/virtual)/ []  [facility]/ [] Home Care/ [] Refused Recommended Disposition /[] [facility]/ [x]  Follow-up with PCP Additional Notes: advised pt concern with sx of sweating without feeling hot, and right sided soreness(near shoulder). Denies left sided chest pain or nausea. Pt refused ED. Prior to transferring call to NT, agent made appt for pt next Monday. Pt stated she cannot come in sooner due to her schedule. Team message sent to [facility] and routing to office for provider to review.  Reason for Disposition  Difficulty breathing or unusual sweating (e.g., sweating without exertion)  Answer Assessment - Initial Assessment Questions 1. ONSET: "When did the pain start?"     Over month 2. LOCATION: "Where is the pain located?"     Right near shoulder 3. PAIN: "How bad is the pain?" (Scale 1-10; or mild, moderate, severe)   - MILD (1-3): Doesn't interfere with normal activities.   - MODERATE (4-7): Interferes with normal activities (e.g., work or school) or awakens from sleep.   - SEVERE (8-10): Excruciating pain, unable to do any normal activities, unable to hold a cup of water.     Severe  4. WORK OR EXERCISE: "Has there been any recent work or exercise that involved this part of the body?"     Cleans house 5. CAUSE: "What do you think is causing the arm pain?"     strain 6. OTHER SYMPTOMS: "Do you have any other symptoms?" (e.g., neck pain, swelling, rash, fever, numbness, weakness)     Sweating  7. PREGNANCY: "Is there any chance you are pregnant?" "When was your last  menstrual period?"     N/a  Protocols used: Arm Pain-A-AH

## 2022-01-23 NOTE — Telephone Encounter (Signed)
Entered in error

## 2022-01-26 ENCOUNTER — Encounter: Payer: Self-pay | Admitting: Nurse Practitioner

## 2022-01-26 ENCOUNTER — Ambulatory Visit: Payer: Commercial Managed Care - HMO | Admitting: Nurse Practitioner

## 2022-01-26 VITALS — BP 128/86 | HR 77 | Temp 98.0°F | Wt 185.5 lb

## 2022-01-26 DIAGNOSIS — M25511 Pain in right shoulder: Secondary | ICD-10-CM | POA: Diagnosis not present

## 2022-01-26 MED ORDER — HYDROCODONE-ACETAMINOPHEN 5-325 MG PO TABS: 1.0000 | ORAL_TABLET | Freq: Four times a day (QID) | ORAL | 0 refills | Status: AC | PRN

## 2022-01-26 MED ORDER — TIZANIDINE HCL 4 MG PO TABS: 4.0000 mg | ORAL_TABLET | Freq: Four times a day (QID) | ORAL | 0 refills | Status: DC | PRN

## 2022-01-26 NOTE — Assessment & Plan Note (Signed)
Acute and ongoing for over one month now.  No red flags on exam and normal x-ray.  Is having some weakness and ongoing pain.  Will place referral to ortho for further recommendations.  May need PT or MRI in future, discussed with patient.  Short burst of Norco sent in, educated patient on this and to use minimally.  Tizanidine for discomfort and rest sent in.  Return as needed.

## 2022-01-26 NOTE — Patient Instructions (Signed)

## 2022-01-26 NOTE — Progress Notes (Signed)
BP 128/86 (BP Location: Left Arm)   Pulse 77   Temp 98 F (36.7 C) (Oral)   Wt 185 lb 8 oz (84.1 kg)   LMP  (LMP Unknown)   SpO2 99%   BMI 29.94 kg/m    Subjective:    Patient ID: Loma Newton, female    DOB: 05/19/1961, 61 y.o.   MRN: [id number]  HPI: Reagen Goates is a 61 y.o. female  Chief Complaint  Patient presents with   Shoulder Pain    Patient says she has been having issues with her R shoulder for the past month. Patient was seen in Urgent Care on 12/18/21. Patient says she was prescribed Prednisone and feels that it did not help her. Patient says she is currently taking Celebrex for her knee pain for almost a year and feels as if it does not help her pain. Patient would like to discuss with provider. Patient has previous history of issues with her R rotator cuff.    Arm Pain   SHOULDER PAIN (RIGHT) Has had shoulder pain for over one month, went to urgent care 12/18/21 -- was prescribed Prednisone which she felt did not help her.  Taking leftover Celebrex (which she had for knee pain), which is not offering benefit. Shoulder x-ray on 12/18/21 noted no fracture or dislocation.    Around 12/11/21 she was lifting a rug to put in the trash, heard something snap in her upper right arm at that time. Duration: months Involved shoulder: right Mechanism of injury:  lifting rug Location: lateral -- deltoid area Onset:gradual Severity: 9/10  Quality:  sharp, aching, and throbbing Frequency: intermittent Radiation: down to right wrist Aggravating factors: lifting, movement, and sleep  Alleviating factors: nothing  Status: fluctuating Treatments attempted: Celebrex, Prednisone, Icy Hot, Tylenol  Relief with NSAIDs?:  No NSAIDs Taken Weakness: yes Numbness: no Decreased grip strength: no Redness: no Swelling: no Bruising: no Fevers: no   Relevant past medical, surgical, family and social history reviewed and updated as indicated. Interim medical history since our last  visit reviewed. Allergies and medications reviewed and updated.  Review of Systems  Constitutional:  Negative for activity change, appetite change, diaphoresis, fatigue and fever.  Respiratory:  Negative for cough, chest tightness and shortness of breath.   Cardiovascular:  Negative for chest pain, palpitations and leg swelling.  Gastrointestinal: Negative.   Musculoskeletal:  Positive for arthralgias.  Neurological: Negative.   Psychiatric/Behavioral: Negative.      Per HPI unless specifically indicated above     Objective:    BP 128/86 (BP Location: Left Arm)   Pulse 77   Temp 98 F (36.7 C) (Oral)   Wt 185 lb 8 oz (84.1 kg)   LMP  (LMP Unknown)   SpO2 99%   BMI 29.94 kg/m   Wt Readings from Last 3 Encounters:  01/26/22 185 lb 8 oz (84.1 kg)  12/18/21 185 lb (83.9 kg)  09/23/21 187 lb (84.8 kg)    Physical Exam Vitals and nursing note reviewed.  Constitutional:      General: She is awake.     Appearance: She is well-developed.  HENT:     Head: Normocephalic.     Right Ear: Hearing normal.     Left Ear: Hearing normal.     Nose: Nose normal.     Mouth/Throat:     Mouth: Mucous membranes are moist.  Eyes:     General: Lids are normal.        Right eye:  No discharge.        Left eye: No discharge.     Conjunctiva/sclera: Conjunctivae normal.     Pupils: Pupils are equal, round, and reactive to light.  Neck:     Thyroid: No thyromegaly.     Vascular: No carotid bruit or JVD.  Cardiovascular:     Rate and Rhythm: Normal rate and regular rhythm.     Heart sounds: Murmur heard.     Systolic murmur is present with a grade of 2/6.     No gallop.  Pulmonary:     Effort: Pulmonary effort is normal.     Breath sounds: Normal breath sounds.  Abdominal:     General: Bowel sounds are normal.     Palpations: Abdomen is soft. There is no hepatomegaly or splenomegaly.  Musculoskeletal:     Right shoulder: Tenderness present. No swelling, effusion, bony tenderness or  crepitus. Normal range of motion (although discomfort with flexion and extension). Decreased strength. Normal pulse.     Left shoulder: Normal.     Cervical back: Normal range of motion and neck supple.     Right lower leg: No edema.     Left lower leg: No edema.  Lymphadenopathy:     Cervical: No cervical adenopathy.  Skin:    General: Skin is warm and dry.  Neurological:     Mental Status: She is alert and oriented to person, place, and time.  Psychiatric:        Attention and Perception: Attention normal.        Mood and Affect: Mood normal.        Behavior: Behavior normal. Behavior is cooperative.        Thought Content: Thought content normal.        Judgment: Judgment normal.     Results for orders placed or performed in visit on 08/19/21  Microalbumin, Urine Waived  Result Value Ref Range   Microalb, Ur Waived 10 0 - 19 mg/L   Creatinine, Urine Waived 10 10 - 300 mg/dL   Microalb/Creat Ratio <30 <30 mg/g  CBC with Differential/Platelet  Result Value Ref Range   WBC 4.9 3.4 - 10.8 x10E3/uL   RBC 4.97 3.77 - 5.28 x10E6/uL   Hemoglobin 13.7 11.1 - 15.9 g/dL   Hematocrit 41.2 34.0 - 46.6 %   MCV 83 79 - 97 fL   MCH 27.6 26.6 - 33.0 pg   MCHC 33.3 31.5 - 35.7 g/dL   RDW 12.5 11.7 - 15.4 %   Platelets 255 150 - 450 x10E3/uL   Neutrophils 55 Not Estab. %   Lymphs 33 Not Estab. %   Monocytes 10 Not Estab. %   Eos 1 Not Estab. %   Basos 1 Not Estab. %   Neutrophils Absolute 2.7 1.4 - 7.0 x10E3/uL   Lymphocytes Absolute 1.6 0.7 - 3.1 x10E3/uL   Monocytes Absolute 0.5 0.1 - 0.9 x10E3/uL   EOS (ABSOLUTE) 0.1 0.0 - 0.4 x10E3/uL   Basophils Absolute 0.0 0.0 - 0.2 x10E3/uL   Immature Granulocytes 0 Not Estab. %   Immature Grans (Abs) 0.0 0.0 - 0.1 x10E3/uL  Comprehensive metabolic panel  Result Value Ref Range   Glucose 92 70 - 99 mg/dL   BUN 15 8 - 27 mg/dL   Creatinine, Ser 0.72 0.57 - 1.00 mg/dL   eGFR 94 >59 mL/min/1.73   BUN/Creatinine Ratio 21 12 - 28   Sodium  135 134 - 144 mmol/L   Potassium 3.5 3.5 -  5.2 mmol/L   Chloride 92 (L) 96 - 106 mmol/L   CO2 26 20 - 29 mmol/L   Calcium 9.8 8.7 - 10.3 mg/dL   Total Protein 7.7 6.0 - 8.5 g/dL   Albumin 5.2 (H) 3.8 - 4.8 g/dL   Globulin, Total 2.5 1.5 - 4.5 g/dL   Albumin/Globulin Ratio 2.1 1.2 - 2.2   Bilirubin Total 0.7 0.0 - 1.2 mg/dL   Alkaline Phosphatase 58 44 - 121 IU/L   AST 26 0 - 40 IU/L   ALT 22 0 - 32 IU/L  Lipid Panel w/o Chol/HDL Ratio  Result Value Ref Range   Cholesterol, Total 239 (H) 100 - 199 mg/dL   Triglycerides 105 0 - 149 mg/dL   HDL 81 >39 mg/dL   VLDL Cholesterol Cal 18 5 - 40 mg/dL   LDL Chol Calc (NIH) 140 (H) 0 - 99 mg/dL  TSH  Result Value Ref Range   TSH 1.010 0.450 - 4.500 uIU/mL  VITAMIN D 25 Hydroxy (Vit-D Deficiency, Fractures)  Result Value Ref Range   Vit D, 25-Hydroxy 9.1 (L) 30.0 - 100.0 ng/mL  ANA w/Reflex if Positive  Result Value Ref Range   Anti Nuclear Antibody (ANA) Negative Negative  C-reactive protein  Result Value Ref Range   CRP 1 0 - 10 mg/L  Sed Rate (ESR)  Result Value Ref Range   Sed Rate 13 0 - 40 mm/hr      Assessment & Plan:   Problem List Items Addressed This Visit       Other   Acute pain of right shoulder - Primary    Acute and ongoing for over one month now.  No red flags on exam and normal x-ray.  Is having some weakness and ongoing pain.  Will place referral to ortho for further recommendations.  May need PT or MRI in future, discussed with patient.  Short burst of Norco sent in, educated patient on this and to use minimally.  Tizanidine for discomfort and rest sent in.  Return as needed.      Relevant Orders   Ambulatory referral to Orthopedics     Follow up plan: Return if symptoms worsen or fail to improve.

## 2022-01-27 ENCOUNTER — Telehealth: Payer: Self-pay

## 2022-01-27 NOTE — Telephone Encounter (Unsigned)
Copied from CRM [id number]. Topic: Referral - Question >> Jan 27, 2022  8:20 AM Pincus Sanes wrote: Reason for CRM: Pt was given referral for [facility] on 8/28, talked to them yesterday and they are not in network with her E[employer] [place] [facility]/ Pls reach out for another referral with her ins, willing to do [facility] if she has to. FU [phone]

## 2022-01-27 NOTE — Telephone Encounter (Signed)
Jessie, can you look into this please? 

## 2022-01-28 NOTE — Telephone Encounter (Signed)
Patient notified that referral has been sent to [facility].

## 2022-02-24 ENCOUNTER — Ambulatory Visit: Payer: Managed Care, Other (non HMO) | Admitting: Nurse Practitioner

## 2022-02-24 DIAGNOSIS — I10 Essential (primary) hypertension: Secondary | ICD-10-CM

## 2022-02-24 DIAGNOSIS — R011 Cardiac murmur, unspecified: Secondary | ICD-10-CM

## 2022-02-24 DIAGNOSIS — E782 Mixed hyperlipidemia: Secondary | ICD-10-CM

## 2022-02-24 DIAGNOSIS — E6609 Other obesity due to excess calories: Secondary | ICD-10-CM

## 2022-02-24 DIAGNOSIS — E559 Vitamin D deficiency, unspecified: Secondary | ICD-10-CM

## 2022-03-05 ENCOUNTER — Other Ambulatory Visit: Payer: Self-pay | Admitting: Student

## 2022-03-05 DIAGNOSIS — M7581 Other shoulder lesions, right shoulder: Secondary | ICD-10-CM

## 2022-03-05 DIAGNOSIS — M7521 Bicipital tendinitis, right shoulder: Secondary | ICD-10-CM

## 2022-03-05 DIAGNOSIS — G8929 Other chronic pain: Secondary | ICD-10-CM

## 2022-03-19 ENCOUNTER — Other Ambulatory Visit: Payer: Self-pay | Admitting: Nurse Practitioner

## 2022-03-19 NOTE — Telephone Encounter (Signed)
Requested medication (s) are due for refill today: yes  Requested medication (s) are on the active medication list: yes  Last refill:  01/26/22 #30/0  Future visit scheduled: yes  Notes to clinic:  Unable to refill per protocol, cannot delegate.      Requested Prescriptions  Pending Prescriptions Disp Refills   tiZANidine (ZANAFLEX) 4 MG tablet [Pharmacy Med Name: tiZANidine HCl 4 MG Oral Tablet] 30 tablet 0    Sig: TAKE 1 TABLET BY MOUTH EVERY 6 HOURS AS NEEDED FOR MUSCLE SPASM     Not Delegated - Cardiovascular:  Alpha-2 Agonists - tizanidine Failed - 03/19/2022  9:17 AM      Failed - This refill cannot be delegated      Passed - Valid encounter within last 6 months    Recent Outpatient Visits           1 month ago Acute pain of right shoulder   [facility], Barbaraann Faster, NP   5 months ago Multiple joint pain   [place], [place] T, NP   7 months ago Primary hypertension   [facility] [place], [place] T, NP   2 years ago Essential hypertension   [place], [place] T, NP   3 years ago Essential hypertension   [facility], Barbaraann Faster, NP       Future Appointments             In 1 week [place], Barbaraann Faster, NP [employer]

## 2022-03-22 NOTE — Patient Instructions (Incomplete)

## 2022-03-26 ENCOUNTER — Ambulatory Visit: Payer: Managed Care, Other (non HMO) | Admitting: Nurse Practitioner

## 2022-03-26 DIAGNOSIS — I493 Ventricular premature depolarization: Secondary | ICD-10-CM

## 2022-03-26 DIAGNOSIS — R011 Cardiac murmur, unspecified: Secondary | ICD-10-CM

## 2022-03-26 DIAGNOSIS — E782 Mixed hyperlipidemia: Secondary | ICD-10-CM

## 2022-03-26 DIAGNOSIS — I10 Essential (primary) hypertension: Secondary | ICD-10-CM

## 2022-03-26 DIAGNOSIS — E6609 Other obesity due to excess calories: Secondary | ICD-10-CM

## 2022-03-26 DIAGNOSIS — E559 Vitamin D deficiency, unspecified: Secondary | ICD-10-CM

## 2022-03-30 ENCOUNTER — Ambulatory Visit: Payer: Commercial Managed Care - HMO | Admitting: Family Medicine

## 2022-03-30 ENCOUNTER — Encounter: Payer: Self-pay | Admitting: Family Medicine

## 2022-03-30 VITALS — BP 158/73 | HR 81 | Temp 97.9°F | Wt 190.8 lb

## 2022-03-30 DIAGNOSIS — N760 Acute vaginitis: Secondary | ICD-10-CM | POA: Diagnosis not present

## 2022-03-30 DIAGNOSIS — N898 Other specified noninflammatory disorders of vagina: Secondary | ICD-10-CM | POA: Diagnosis not present

## 2022-03-30 DIAGNOSIS — B9689 Other specified bacterial agents as the cause of diseases classified elsewhere: Secondary | ICD-10-CM | POA: Diagnosis not present

## 2022-03-30 LAB — URINALYSIS, ROUTINE W REFLEX MICROSCOPIC
Bilirubin, UA: NEGATIVE
Glucose, UA: NEGATIVE
Ketones, UA: NEGATIVE
Nitrite, UA: NEGATIVE
Protein,UA: NEGATIVE
RBC, UA: NEGATIVE
Specific Gravity, UA: <1.005 — ABNORMAL LOW (ref 1.005–1.030)
Urobilinogen, Ur: 0.2 mg/dL (ref 0.2–1.0)
pH, UA: 6.5 (ref 5.0–7.5)

## 2022-03-30 LAB — WET PREP FOR TRICH, YEAST, CLUE
Clue Cell Exam: POSITIVE — AB
Trichomonas Exam: NEGATIVE
Yeast Exam: NEGATIVE

## 2022-03-30 LAB — MICROSCOPIC EXAMINATION: Bacteria, UA: NONE SEEN

## 2022-03-30 MED ORDER — METRONIDAZOLE 500 MG PO TABS: 500.0000 mg | ORAL_TABLET | Freq: Two times a day (BID) | ORAL | 0 refills | Status: DC

## 2022-03-30 NOTE — Progress Notes (Signed)
 BP (!) 158/73   Pulse 81   Temp 97.9 F (36.6 C)   Wt 190 lb 12.8 oz (86.5 kg)   LMP  (LMP Unknown)   SpO2 100%   BMI 30.80 kg/m    Subjective:    Patient ID: Allison Reynolds, female    DOB: 10/06/1960, 61 y.o.   MRN: [id number]  HPI: Allison Reynolds is a 61 y.o. female  Chief Complaint  Patient presents with   Vaginal Itching    Patient states she is having vaginal burning on the outer area and itching. Patient does noticed redness around her labia for about 3 days.    VAGINAL IRRITATION Duration: couple of days Discharge description: none  Pruritus: yes Dysuria: no Malodorous: no Urinary frequency: no Fevers: no Abdominal pain: yes  Sexual activity: monogamous History of sexually transmitted diseases: no Recent antibiotic use: no Context: stable  Treatments attempted: none  Relevant past medical, surgical, family and social history reviewed and updated as indicated. Interim medical history since our last visit reviewed. Allergies and medications reviewed and updated.  Review of Systems  Constitutional: Negative.   Respiratory: Negative.    Cardiovascular: Negative.   Gastrointestinal: Negative.   Genitourinary:  Positive for vaginal pain. Negative for decreased urine volume, difficulty urinating, dyspareunia, dysuria, enuresis, flank pain, frequency, genital sores, hematuria, menstrual problem, pelvic pain, urgency, vaginal bleeding and vaginal discharge.  Musculoskeletal: Negative.   Psychiatric/Behavioral: Negative.      Per HPI unless specifically indicated above     Objective:    BP (!) 158/73   Pulse 81   Temp 97.9 F (36.6 C)   Wt 190 lb 12.8 oz (86.5 kg)   LMP  (LMP Unknown)   SpO2 100%   BMI 30.80 kg/m   Wt Readings from Last 3 Encounters:  03/30/22 190 lb 12.8 oz (86.5 kg)  01/26/22 185 lb 8 oz (84.1 kg)  12/18/21 185 lb (83.9 kg)    Physical Exam Vitals and nursing note reviewed.  Constitutional:      General: She is not in acute  distress.    Appearance: Normal appearance. She is obese. She is not ill-appearing, toxic-appearing or diaphoretic.  HENT:     Head: Normocephalic and atraumatic.     Right Ear: External ear normal.     Left Ear: External ear normal.     Nose: Nose normal.     Mouth/Throat:     Mouth: Mucous membranes are moist.     Pharynx: Oropharynx is clear.  Eyes:     General: No scleral icterus.       Right eye: No discharge.        Left eye: No discharge.     Extraocular Movements: Extraocular movements intact.     Conjunctiva/sclera: Conjunctivae normal.     Pupils: Pupils are equal, round, and reactive to light.  Cardiovascular:     Rate and Rhythm: Normal rate and regular rhythm.     Pulses: Normal pulses.     Heart sounds: Murmur heard.     No friction rub. No gallop.  Pulmonary:     Effort: Pulmonary effort is normal. No respiratory distress.     Breath sounds: Normal breath sounds. No stridor. No wheezing, rhonchi or rales.  Chest:     Chest wall: No tenderness.  Musculoskeletal:        General: Normal range of motion.     Cervical back: Normal range of motion and neck supple.  Skin:      General: Skin is warm and dry.     Capillary Refill: Capillary refill takes less than 2 seconds.     Coloration: Skin is not jaundiced or pale.     Findings: No bruising, erythema, lesion or rash.  Neurological:     General: No focal deficit present.     Mental Status: She is alert and oriented to person, place, and time. Mental status is at baseline.  Psychiatric:        Mood and Affect: Mood normal.        Behavior: Behavior normal.        Thought Content: Thought content normal.        Judgment: Judgment normal.     Results for orders placed or performed in visit on 08/19/21  Microalbumin, Urine Waived  Result Value Ref Range   Microalb, Ur Waived 10 0 - 19 mg/L   Creatinine, Urine Waived 10 10 - 300 mg/dL   Microalb/Creat Ratio <30 <30 mg/g  CBC with Differential/Platelet  Result  Value Ref Range   WBC 4.9 3.4 - 10.8 x10E3/uL   RBC 4.97 3.77 - 5.28 x10E6/uL   Hemoglobin 13.7 11.1 - 15.9 g/dL   Hematocrit 41.2 34.0 - 46.6 %   MCV 83 79 - 97 fL   MCH 27.6 26.6 - 33.0 pg   MCHC 33.3 31.5 - 35.7 g/dL   RDW 12.5 11.7 - 15.4 %   Platelets 255 150 - 450 x10E3/uL   Neutrophils 55 Not Estab. %   Lymphs 33 Not Estab. %   Monocytes 10 Not Estab. %   Eos 1 Not Estab. %   Basos 1 Not Estab. %   Neutrophils Absolute 2.7 1.4 - 7.0 x10E3/uL   Lymphocytes Absolute 1.6 0.7 - 3.1 x10E3/uL   Monocytes Absolute 0.5 0.1 - 0.9 x10E3/uL   EOS (ABSOLUTE) 0.1 0.0 - 0.4 x10E3/uL   Basophils Absolute 0.0 0.0 - 0.2 x10E3/uL   Immature Granulocytes 0 Not Estab. %   Immature Grans (Abs) 0.0 0.0 - 0.1 x10E3/uL  Comprehensive metabolic panel  Result Value Ref Range   Glucose 92 70 - 99 mg/dL   BUN 15 8 - 27 mg/dL   Creatinine, Ser 0.72 0.57 - 1.00 mg/dL   eGFR 94 >59 mL/min/1.73   BUN/Creatinine Ratio 21 12 - 28   Sodium 135 134 - 144 mmol/L   Potassium 3.5 3.5 - 5.2 mmol/L   Chloride 92 (L) 96 - 106 mmol/L   CO2 26 20 - 29 mmol/L   Calcium 9.8 8.7 - 10.3 mg/dL   Total Protein 7.7 6.0 - 8.5 g/dL   Albumin 5.2 (H) 3.8 - 4.8 g/dL   Globulin, Total 2.5 1.5 - 4.5 g/dL   Albumin/Globulin Ratio 2.1 1.2 - 2.2   Bilirubin Total 0.7 0.0 - 1.2 mg/dL   Alkaline Phosphatase 58 44 - 121 IU/L   AST 26 0 - 40 IU/L   ALT 22 0 - 32 IU/L  Lipid Panel w/o Chol/HDL Ratio  Result Value Ref Range   Cholesterol, Total 239 (H) 100 - 199 mg/dL   Triglycerides 105 0 - 149 mg/dL   HDL 81 >39 mg/dL   VLDL Cholesterol Cal 18 5 - 40 mg/dL   LDL Chol Calc (NIH) 140 (H) 0 - 99 mg/dL  TSH  Result Value Ref Range   TSH 1.010 0.450 - 4.500 uIU/mL  VITAMIN D 25 Hydroxy (Vit-D Deficiency, Fractures)  Result Value Ref Range   Vit D, 25-Hydroxy 9.1 (L) 30.0 -  100.0 ng/mL  ANA w/Reflex if Positive  Result Value Ref Range   Anti Nuclear Antibody (ANA) Negative Negative  C-reactive protein  Result Value Ref  Range   CRP 1 0 - 10 mg/L  Sed Rate (ESR)  Result Value Ref Range   Sed Rate 13 0 - 40 mm/hr      Assessment & Plan:   Problem List Items Addressed This Visit   None Visit Diagnoses     BV (bacterial vaginosis)    -  Primary   Will treat with flagyl. Call with any concerns. Continue to monitor.    Relevant Medications   metroNIDAZOLE (FLAGYL) 500 MG tablet   Vaginal itching       +clue cells   Relevant Orders   Urinalysis, Routine w reflex microscopic   WET PREP FOR TRICH, YEAST, CLUE        Follow up plan: Return if symptoms worsen or fail to improve.      

## 2022-03-31 ENCOUNTER — Ambulatory Visit
Admission: RE | Admit: 2022-03-31 | Discharge: 2022-03-31 | Disposition: A | Discharge status: Home / Self Care | Payer: Commercial Managed Care - HMO | Source: Ambulatory Visit | Attending: Student | Admitting: Student

## 2022-03-31 DIAGNOSIS — G8929 Other chronic pain: Secondary | ICD-10-CM

## 2022-03-31 DIAGNOSIS — M7581 Other shoulder lesions, right shoulder: Secondary | ICD-10-CM

## 2022-03-31 DIAGNOSIS — M7521 Bicipital tendinitis, right shoulder: Secondary | ICD-10-CM

## 2022-04-12 NOTE — Patient Instructions (Incomplete)

## 2022-04-14 ENCOUNTER — Ambulatory Visit: Payer: Managed Care, Other (non HMO) | Admitting: Nurse Practitioner

## 2022-04-14 DIAGNOSIS — E559 Vitamin D deficiency, unspecified: Secondary | ICD-10-CM

## 2022-04-14 DIAGNOSIS — R011 Cardiac murmur, unspecified: Secondary | ICD-10-CM

## 2022-04-14 DIAGNOSIS — E6609 Other obesity due to excess calories: Secondary | ICD-10-CM

## 2022-04-14 DIAGNOSIS — E782 Mixed hyperlipidemia: Secondary | ICD-10-CM

## 2022-04-14 DIAGNOSIS — Z23 Encounter for immunization: Secondary | ICD-10-CM

## 2022-04-14 DIAGNOSIS — I10 Essential (primary) hypertension: Secondary | ICD-10-CM

## 2022-07-26 ENCOUNTER — Ambulatory Visit (INDEPENDENT_AMBULATORY_CARE_PROVIDER_SITE_OTHER): Payer: Self-pay

## 2022-07-26 ENCOUNTER — Ambulatory Visit
Admission: EM | Admit: 2022-07-26 | Discharge: 2022-07-26 | Disposition: A | Discharge status: Home / Self Care | Payer: Self-pay | Attending: Family Medicine | Admitting: Family Medicine

## 2022-07-26 ENCOUNTER — Encounter: Payer: Self-pay | Admitting: Emergency Medicine

## 2022-07-26 DIAGNOSIS — M6788 Other specified disorders of synovium and tendon, other site: Secondary | ICD-10-CM

## 2022-07-26 DIAGNOSIS — M79672 Pain in left foot: Secondary | ICD-10-CM

## 2022-07-26 MED ORDER — NAPROXEN 500 MG PO TABS: 500.0000 mg | ORAL_TABLET | Freq: Two times a day (BID) | ORAL | 0 refills | Status: DC

## 2022-07-26 NOTE — ED Triage Notes (Signed)
Patient c/o pain in the left heel of her foot and at the back of her left foot. Patient has some redness at the site.  Patient states it started yesterday.  Patient denies any injury.

## 2022-07-26 NOTE — ED Provider Notes (Signed)
[facility]    CSN: [id number] Arrival date & time: 07/26/22  0803      History   Chief Complaint Chief Complaint  Patient presents with   Foot Pain    left    HPI  HPI Allison Reynolds is a 62 y.o. female.   Leather presents for left posterior heel pain that is tender and she had trouble walking. She can't stand on her heel at all. Pain started yesterday while walking at work. She had to prop her leg up with a pillow at work last night. Has left knee arthritis so she took Celebrex and Tylenol that didn't help. Walking makes the pain. Sitting down makes the pain worse. She did not hear any abnormal pops and sounds. Pain described as throbbing. She has never injured this foot before.     Past Medical History:  Diagnosis Date   Arthritis    Heart murmur    Hypertension    Thyroid disease     Patient Active Problem List   Diagnosis Date Noted   Acute pain of right shoulder 01/26/2022   Vitamin D deficiency 08/20/2021   Multiple joint pain 08/19/2021   Obesity 01/30/2020   PVC (premature ventricular contraction) 06/15/2017   Hyperlipidemia [id number]   Systolic murmur [id number]   Hypertension 01/12/2017    Past Surgical History:  Procedure Laterality Date   ABDOMINAL HYSTERECTOMY  age 25   BREAST BIOPSY Right 2016   benign   CESAREAN SECTION  1981, 1983    OB History     Gravida  3   Para  3   Term      Preterm      AB  0   Living  2      SAB  0   IAB      Ectopic      Multiple      Live Births           Obstetric Comments  Age first menstrual period 17 Age first pregnancy 77          Home Medications    Prior to Admission medications   Medication Sig Start Date End Date Taking? Authorizing Provider  amLODipine (NORVASC) 10 MG tablet Take 1 tablet (10 mg total) by mouth daily. 08/19/21  Yes Cannady, Jolene T, NP  celecoxib (CELEBREX) 100 MG capsule Take 1 capsule (100 mg total) by mouth daily as needed. 08/19/21   Yes Cannady, Jolene T, NP  chlorthalidone (HYGROTON) 25 MG tablet Take 1 tablet (25 mg total) by mouth daily. 08/19/21  Yes Cannady, Jolene T, NP  lisinopril (ZESTRIL) 40 MG tablet Take 1 tablet (40 mg total) by mouth daily. 08/19/21  Yes Cannady, Jolene T, NP  naproxen (NAPROSYN) 500 MG tablet Take 1 tablet (500 mg total) by mouth 2 (two) times daily with a meal. 07/26/22  Yes Arlissa Monteverde, DO  Cholecalciferol 1.25 MG (50000 UT) TABS Take 1 tablet by mouth once a week. 08/20/21   Cannady, Henrine Screws T, NP  Methylcobalamin (B-12) 1000 MCG TBDP Take 2 capsules by mouth daily.    [provider]  metroNIDAZOLE (FLAGYL) 500 MG tablet Take 1 tablet (500 mg total) by mouth 2 (two) times daily. 03/30/22   Johnson, Megan P, DO  rosuvastatin (CRESTOR) 10 MG tablet Take 1 tablet (10 mg total) by mouth daily. 08/20/21   Cannady, Jolene T, NP  tiZANidine (ZANAFLEX) 4 MG tablet TAKE 1 TABLET BY MOUTH EVERY 6 HOURS AS NEEDED FOR  MUSCLE SPASM 03/19/22   Marnee Guarneri T, NP    Family History Family History  Problem Relation Age of Onset   Hypertension Mother    Hypertension Father    Heart murmur Father    Hypertension Daughter    Lupus Paternal Aunt     Social History Social History   Tobacco Use   Smoking status: Some Days    Years: 2.00    Types: Cigarettes   Smokeless tobacco: Never   Tobacco comments:    1 pack per week  Vaping Use   Vaping Use: Never used  Substance Use Topics   Alcohol use: Yes    Comment: occasionally   Drug use: No     Allergies   Patient has no known allergies.   Review of Systems Review of Systems: :negative unless otherwise stated in HPI.      Physical Exam Triage Vital Signs ED Triage Vitals  Enc Vitals Group     BP      Pulse      Resp      Temp      Temp src      SpO2      Weight      Height      Head Circumference      Peak Flow      Pain Score      Pain Loc      Pain Edu?      Excl. in [place]?    No data found.  Updated Vital  Signs BP (!) 148/93 (BP Location: Left Arm)   Pulse 77   Temp 97.9 F (36.6 C) (Oral)   Resp 14   Ht '5\' 6"'$  (1.676 m)   Wt 86.5 kg   LMP  (LMP Unknown)   SpO2 100%   BMI 30.78 kg/m   Visual Acuity Right Eye Distance:   Left Eye Distance:   Bilateral Distance:    Right Eye Near:   Left Eye Near:    Bilateral Near:     Physical Exam GEN: well appearing female in no acute distress  CVS: well perfused, distal DP pulses  equal and strong RESP: speaking in full sentences without pause, no respiratory distress  MSK:  Ankle/Foot, Left: TTP noted at the achilles tendon insertion site. No tenderness across the body of the Achilles tendon;  Unremarkable squeeze, No visible erythema, swelling, ecchymosis, or bony deformity. No notable pes planus/cavus deformity. Transverse arch grossly intact; No evidence of tibiotalar deviation; Range of motion is full in all directions. Strength is 5/5 in all directions. No tenderness on posterior aspects of lateral and medial malleolus; Talar dome non-tender; Unremarkable calcaneal squeeze; No plantar calcaneal tenderness; No tenderness over the navicular prominence or over cuboid; No pain at base of 5th MT; No tenderness at the distal metatarsals; Able to walk 4 steps but with pain.      UC Treatments / Results  Labs (all labs ordered are listed, but only abnormal results are displayed) Labs Reviewed - No data to display  EKG   Radiology DG Foot Complete Left  Result Date: 07/26/2022 CLINICAL DATA:  LEFT heel pain.  Some redness. EXAM: LEFT FOOT - COMPLETE 3+ VIEW COMPARISON:  None Available. FINDINGS: Osseous alignment is normal. Bone mineralization is normal. No fracture line or displaced fracture fragment is seen. No focal cortical irregularity or osseous lesion. No destructive change, focal demineralization or other signs of osteomyelitis. Minimal degenerative spurring at the Achilles tendon insertion site at  the posterior calcaneus. Soft  tissues about the LEFT foot are otherwise unremarkable. No soft tissue gas. IMPRESSION: 1. No acute findings. No evidence of osteomyelitis. 2. Minimal degenerative spurring at the Achilles tendon insertion site at the posterior calcaneus. Electronically Signed   By: Franki Cabot M.D.   On: 07/26/2022 09:13    Procedures Procedures (including critical care time)  Medications Ordered in UC Medications - No data to display  Initial Impression / Assessment and Plan / UC Course  I have reviewed the triage vital signs and the nursing notes.  Pertinent labs & imaging results that were available during my care of the patient were reviewed by me and considered in my medical decision making (see chart for details).      Pt is a 62 y.o.  female with 1-2 days of left foot pain. On exam, she has pain at the insertion of the achilles tendon. Squeeze test is unremarkable.   Obtained left foot plain films.  Personally reviewed by me were unremarkable for fracture or dislocation. Radiologist notes  no soft tissue swelling but states pt has minimal degenerative spurring at the achilles tendon site.   Patient to gradually return to normal activities, as tolerated and continue ordinary activities within the limits permitted by pain. Prescribed Naproxen sodium for pain relief.  Tylenol PRN. Advised patient to avoid OTC NSAIDs while taking prescription NSAID. She is to hold her home Celebrex. Recommended topical therapies for multimodal pain relief.   Patient to follow up with orthopedic provider, if symptoms do not improve with conservative treatment.  Return and ED precautions given. Understanding voiced. Discussed MDM, treatment plan and plan for follow-up with patient who agrees with plan.   Final Clinical Impressions(s) / UC Diagnoses   Final diagnoses:  Foot pain, left  Achilles tendonosis of left lower extremity     Discharge Instructions      Your xray did not show any fractured or dislocated  bones.  You have some degenerative changes near your heel near your Achilles tendon. I suspect this is the cause of your pain.   Hold your Celebrex while taking Naprosyn.  Take Naproxen with food. Apply cold or warm compresses to the site for additional pain relief. You can cut a piece of lidocaine or apply some BioFreeze and place it on the area as well.      ED Prescriptions     Medication Sig Dispense Auth. Provider   naproxen (NAPROSYN) 500 MG tablet Take 1 tablet (500 mg total) by mouth 2 (two) times daily with a meal. 30 tablet Sederick Jacobsen, DO      PDMP not reviewed this encounter.   Lyndee Hensen, DO 07/26/22 1455

## 2022-07-26 NOTE — Discharge Instructions (Addendum)
Your xray did not show any fractured or dislocated bones.  You have some degenerative changes near your heel near your Achilles tendon. I suspect this is the cause of your pain.   Hold your Celebrex while taking Naprosyn.  Take Naproxen with food. Apply cold or warm compresses to the site for additional pain relief. You can cut a piece of lidocaine or apply some BioFreeze and place it on the area as well.

## 2022-08-07 ENCOUNTER — Ambulatory Visit (LOCAL_COMMUNITY_HEALTH_CENTER): Payer: Self-pay

## 2022-08-07 DIAGNOSIS — Z111 Encounter for screening for respiratory tuberculosis: Secondary | ICD-10-CM

## 2022-08-10 ENCOUNTER — Ambulatory Visit (LOCAL_COMMUNITY_HEALTH_CENTER): Payer: Self-pay

## 2022-08-10 DIAGNOSIS — Z111 Encounter for screening for respiratory tuberculosis: Secondary | ICD-10-CM

## 2022-08-10 LAB — TB SKIN TEST
Induration: 7 mm
TB Skin Test: NEGATIVE

## 2022-09-23 IMAGING — MG MM DIGITAL SCREENING BILAT W/ TOMO AND CAD
6 of 12 series · 6 of 36 positions shown · non-contrast
Comparison: Previous exam(s).

CLINICAL DATA: Screening.

EXAM:
DIGITAL SCREENING BILATERAL MAMMOGRAM WITH TOMOSYNTHESIS AND CAD
TECHNIQUE: Bilateral screening digital craniocaudal and mediolateral oblique
mammograms were obtained. Bilateral screening digital breast
tomosynthesis was performed. The images were evaluated with
computer-aided detection.

[L MLO synth-2D]
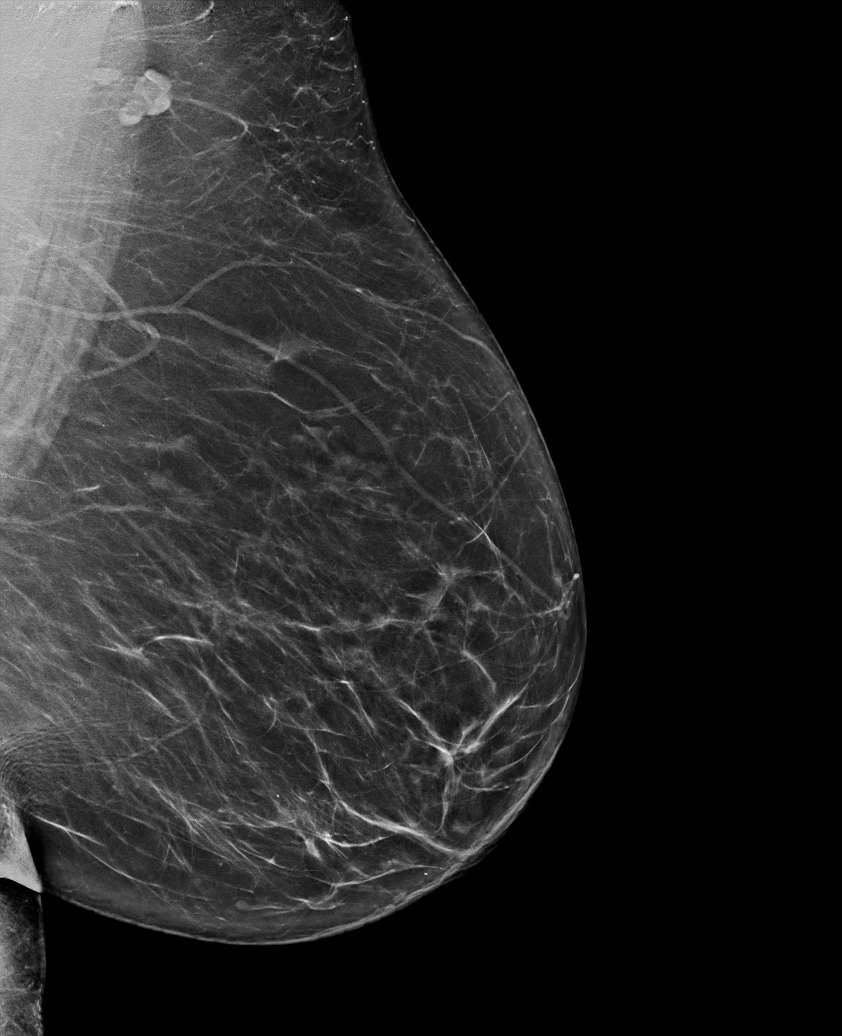

[R CC synth-2D (1 of 2)]
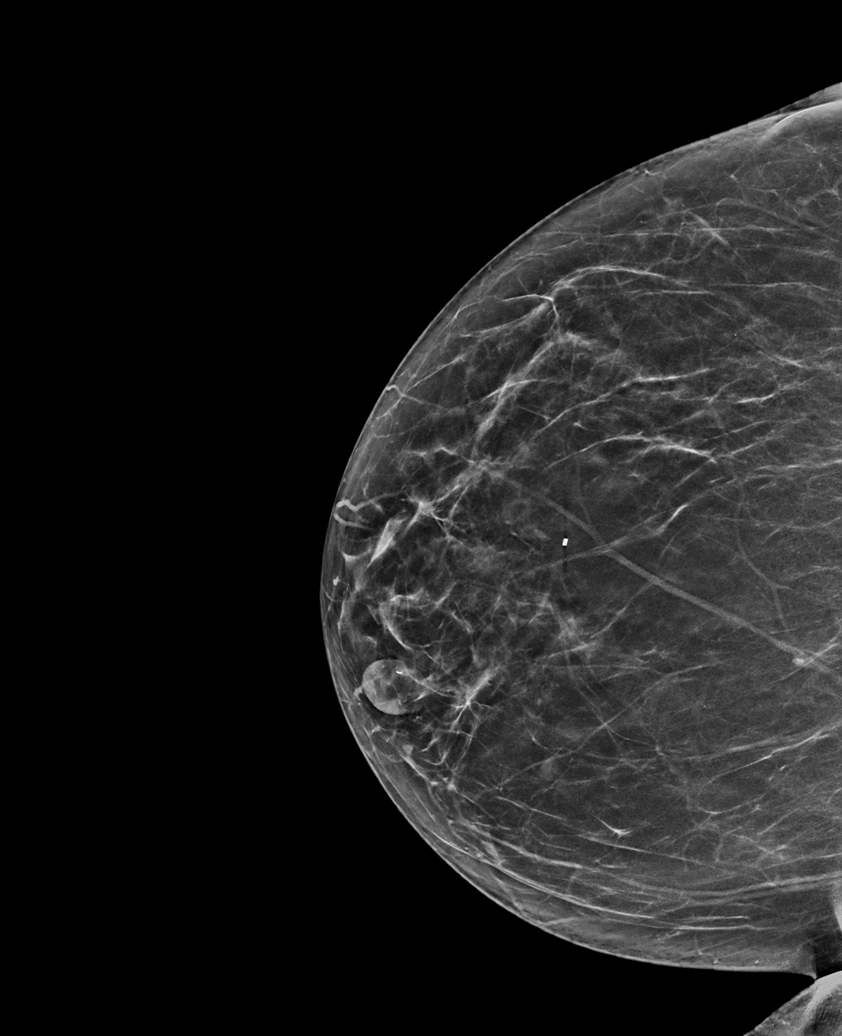

[L CC synth-2D (1 of 2)]
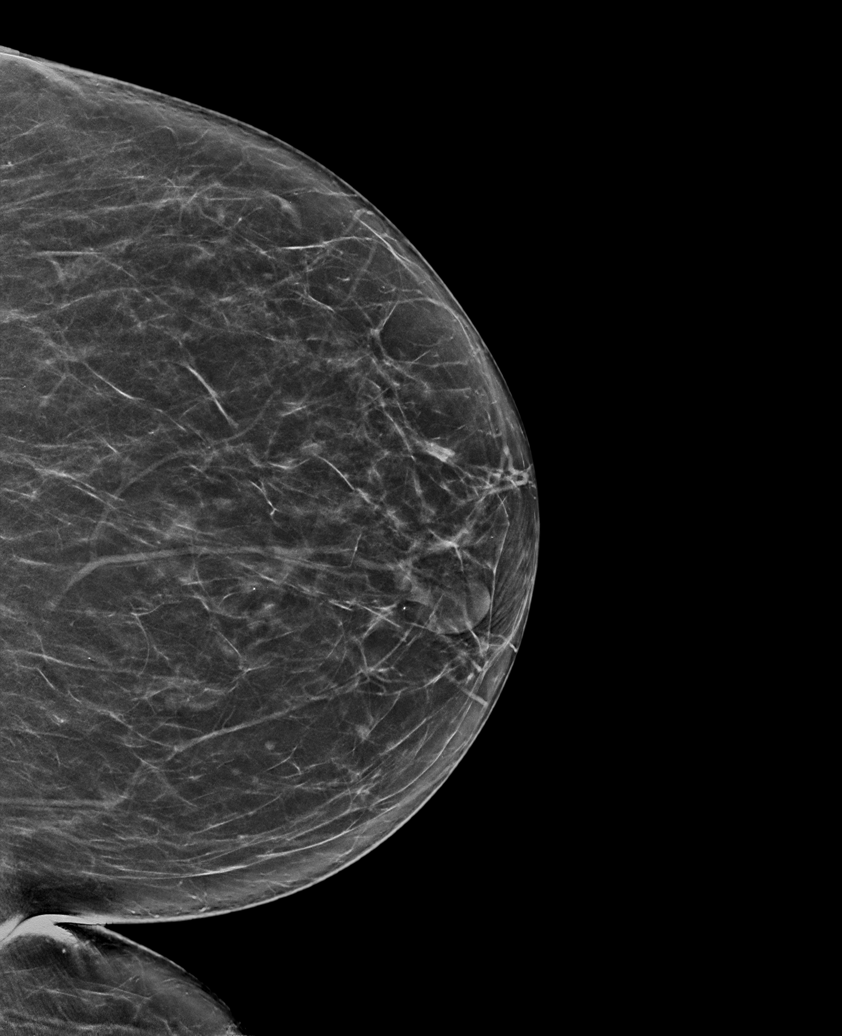

[R CC synth-2D (2 of 2)]
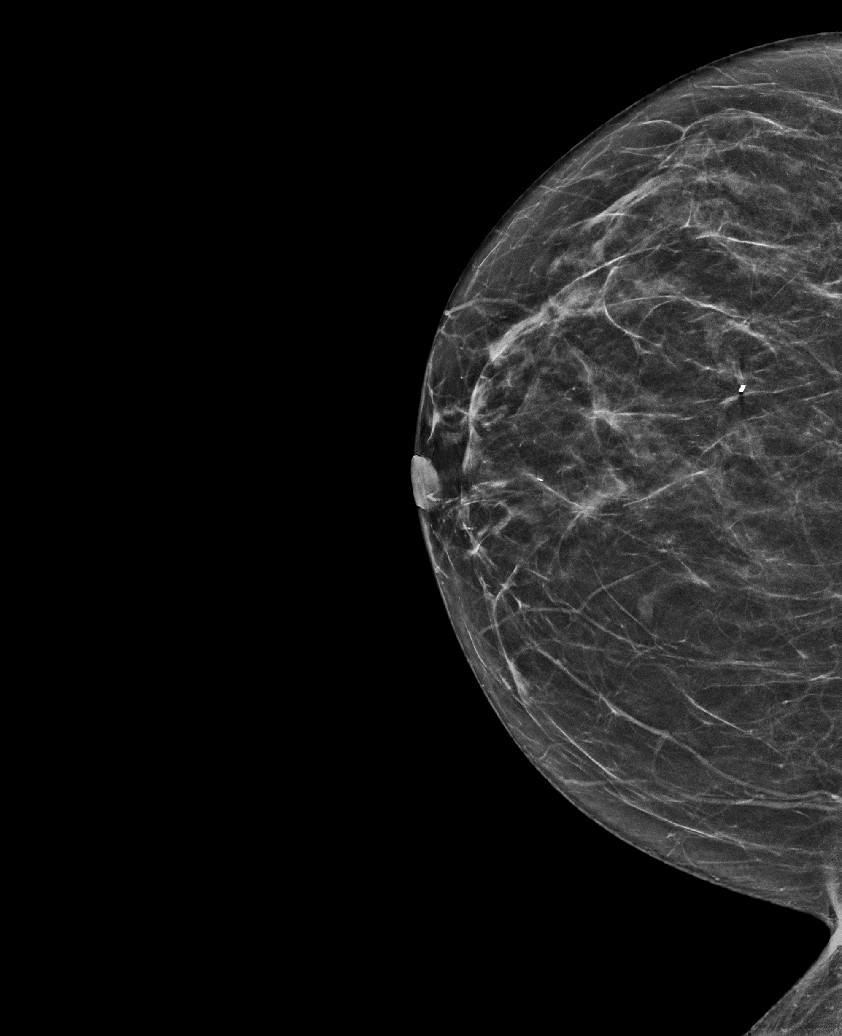

[R MLO synth-2D]
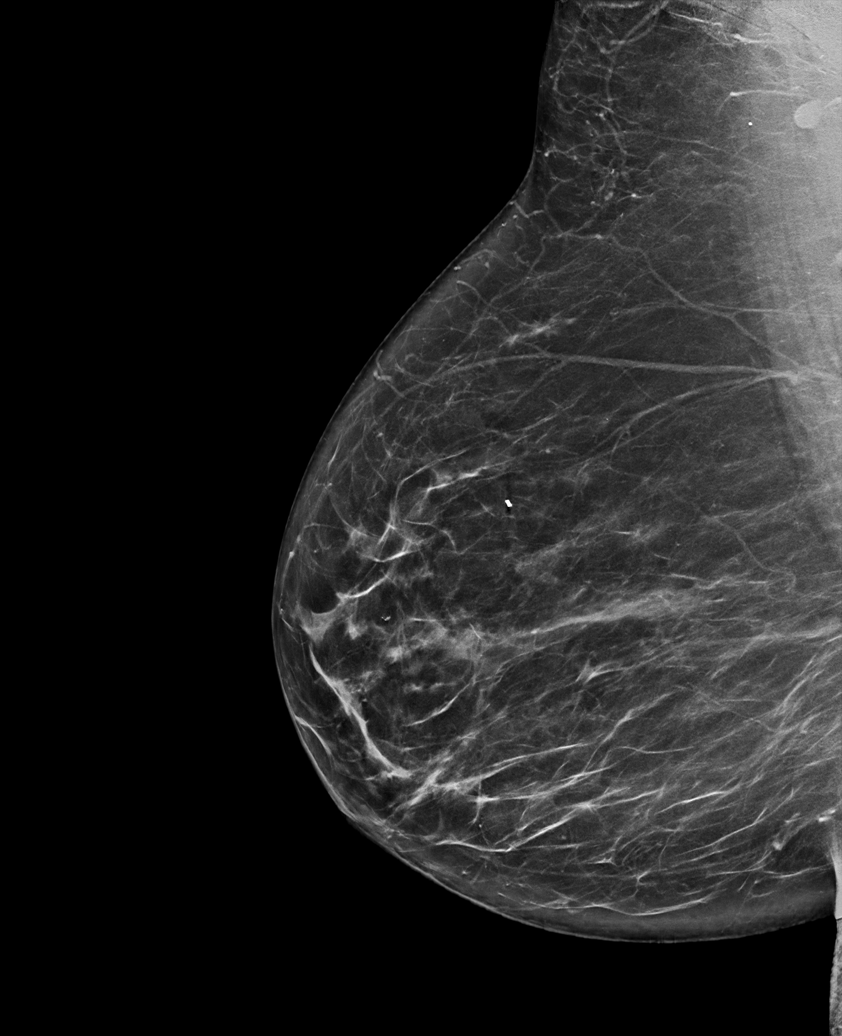

[L CC synth-2D (2 of 2)]
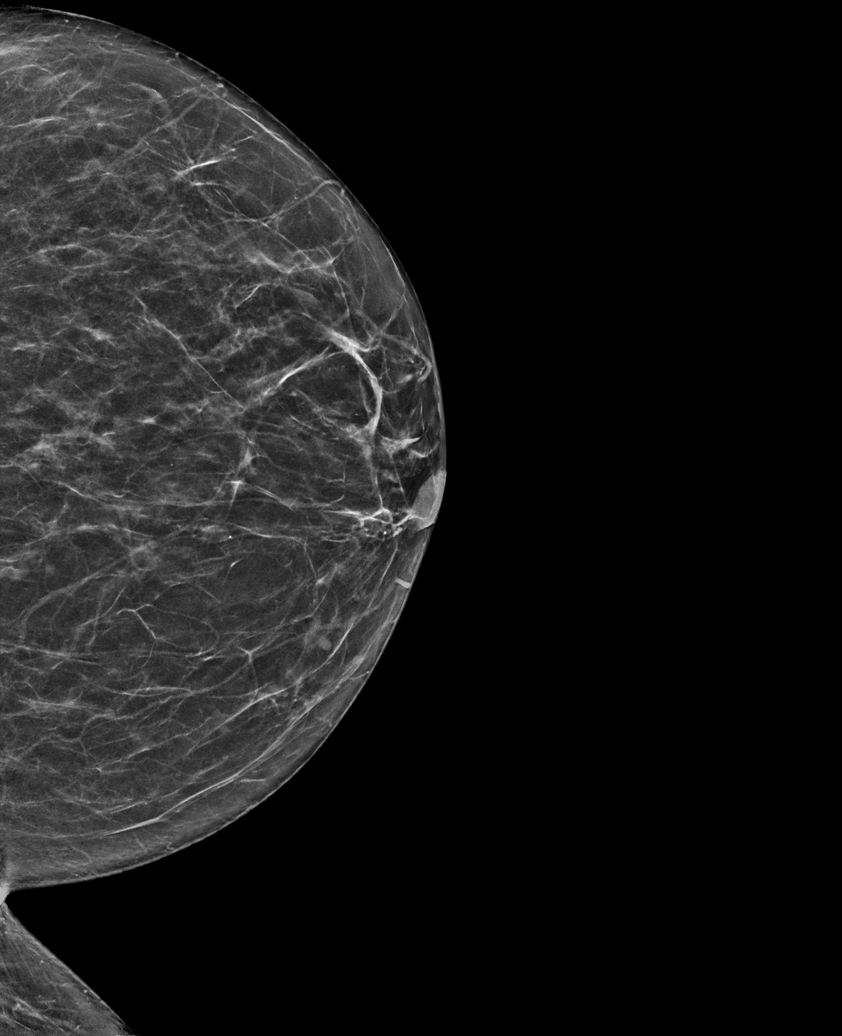

[6 of 36 positions shown; findings below may reference images not displayed]

ACR Breast Density Category b: There are scattered areas of
fibroglandular density.
FINDINGS: There are no findings suspicious for malignancy.
IMPRESSION: No mammographic evidence of malignancy. A result letter of this
screening mammogram will be mailed directly to the patient.

RECOMMENDATION:
Screening mammogram in one year. (Code:51-O-LD2)

BI-RADS CATEGORY  1: Negative.

## 2022-12-02 ENCOUNTER — Other Ambulatory Visit: Payer: Self-pay | Admitting: Nurse Practitioner

## 2022-12-02 NOTE — Telephone Encounter (Signed)
Requested medication (s) are due for refill today: yes  Requested medication (s) are on the active medication list: yes  Last refill:  08/19/21  Future visit scheduled: no  Notes to clinic:  Unable to refill per protocol due to failed labs, no updated results.      Requested Prescriptions  Pending Prescriptions Disp Refills   celecoxib (CELEBREX) 100 MG capsule [Pharmacy Med Name: Celecoxib 100 MG Oral Capsule] 30 capsule 0    Sig: TAKE 1 CAPSULE BY MOUTH ONCE DAILY AS NEEDED     Analgesics:  COX2 Inhibitors Failed - 12/02/2022  5:11 AM      Failed - Manual Review: Labs are only required if the patient has taken medication for more than 8 weeks.      Failed - HGB in normal range and within 360 days    Hemoglobin  Date Value Ref Range Status  08/19/2021 13.7 11.1 - 15.9 g/dL Final         Failed - Cr in normal range and within 360 days    Creatinine, Ser  Date Value Ref Range Status  08/19/2021 0.72 0.57 - 1.00 mg/dL Final         Failed - HCT in normal range and within 360 days    Hematocrit  Date Value Ref Range Status  08/19/2021 41.2 34.0 - 46.6 % Final         Failed - AST in normal range and within 360 days    AST  Date Value Ref Range Status  08/19/2021 26 0 - 40 IU/L Final         Failed - ALT in normal range and within 360 days    ALT  Date Value Ref Range Status  08/19/2021 22 0 - 32 IU/L Final         Failed - eGFR is 30 or above and within 360 days    GFR calc Af Amer  Date Value Ref Range Status  06/15/2017 111 >59 mL/min/1.73 Final   GFR calc non Af Amer  Date Value Ref Range Status  06/15/2017 96 >59 mL/min/1.73 Final   eGFR  Date Value Ref Range Status  08/19/2021 94 >59 mL/min/1.73 Final         Passed - Patient is not pregnant      Passed - Valid encounter within last 12 months    Recent Outpatient Visits           8 months ago BV (bacterial vaginosis)   [place] [facility] Guion, Megan P, DO   10 months ago  Acute pain of right shoulder   [place] [facility] [place], Dorie Rank, NP   1 year ago Multiple joint pain   [place] [facility] [place], Corrie Dandy T, NP   1 year ago Primary hypertension   [place] [facility] [place], [place] T, NP   2 years ago Essential hypertension   [place] [facility] [place], Dorie Rank, NP

## 2022-12-16 ENCOUNTER — Ambulatory Visit
Admission: EM | Admit: 2022-12-16 | Discharge: 2022-12-16 | Disposition: A | Discharge status: Home / Self Care | Payer: Self-pay | Attending: Family Medicine | Admitting: Family Medicine

## 2022-12-16 DIAGNOSIS — I160 Hypertensive urgency: Secondary | ICD-10-CM | POA: Insufficient documentation

## 2022-12-16 DIAGNOSIS — J029 Acute pharyngitis, unspecified: Secondary | ICD-10-CM | POA: Insufficient documentation

## 2022-12-16 LAB — GROUP A STREP BY PCR: Group A Strep by PCR: NOT DETECTED

## 2022-12-16 LAB — BASIC METABOLIC PANEL
Anion gap: 9 (ref 5–15)
BUN: 6 mg/dL — ABNORMAL LOW (ref 8–23)
CO2: 28 mmol/L (ref 22–32)
Calcium: 9.2 mg/dL (ref 8.9–10.3)
Chloride: 96 mmol/L — ABNORMAL LOW (ref 98–111)
Creatinine, Ser: 0.72 mg/dL (ref 0.44–1.00)
GFR, Estimated: >60 mL/min (ref >60)
Glucose, Bld: 98 mg/dL (ref 70–99)
Potassium: 3.9 mmol/L (ref 3.5–5.1)
Sodium: 133 mmol/L — ABNORMAL LOW (ref 135–145)

## 2022-12-16 LAB — MONONUCLEOSIS SCREEN: Mono Screen: NEGATIVE

## 2022-12-16 MED ORDER — LIDOCAINE VISCOUS HCL 2 % MT SOLN: 15.0000 mL | OROMUCOSAL | 0 refills | Status: DC | PRN

## 2022-12-16 MED ORDER — LIDOCAINE VISCOUS HCL 2 % MT SOLN
15.0000 mL | Freq: Once | OROMUCOSAL | Status: AC
  Administered 2022-12-16: 15 mL via OROMUCOSAL

## 2022-12-16 NOTE — Discharge Instructions (Addendum)
Your strep and monotest were normal.  Your blood pressure is absolutely too high.  Be sure to take your blood pressure medications regularly and keep a log for your primary care provider.  Follow-up with them in 2 weeks time take your log with you.  Kidney function is normal.  You did have slightly low salt levels.  Be sure to eat some salty food over the next day.   As you declined a COVID test here be sure to take 1 at home.

## 2022-12-16 NOTE — ED Triage Notes (Signed)
Pt present sore throat with difficulty swallowing. Symptom started two days ago.

## 2022-12-16 NOTE — ED Provider Notes (Signed)
[facility]    CSN: [id number] Arrival date & time: 12/16/22  0102      History   Chief Complaint Chief Complaint  Patient presents with   Sore Throat    HPI Allison Reynolds is a 62 y.o. female.   HPI  History obtained from the patient. Allison Reynolds presents for sore throat started a couple days ago. Has painful swallowing.  She works in Teacher, music. Feels like she has strep and requests strep test. Taking Tylenol, Alkaseltza plus, cold and sinus medication.    She has not taken her BP medication in the past 3 days. She says she took medication around 6 30 AM.  Takes Lisinopril, Chlorthalidone and amlodipine.    Fever : no  Chills: no Sore throat: yes Cough: yes Sputum: no Chest tightness: no Shortness of breath: no Wheezing: no  Nasal congestion : no  Rhinorrhea: yes Fatigue: yes  Appetite: normal  Hydration: normal  Abdominal pain: no Nausea: no Vomiting: no Diarrhea: No Rash: No Sleep disturbance: no Headache: intermittently      Past Medical History:  Diagnosis Date   Arthritis    Heart murmur    Hypertension    Thyroid disease     Patient Active Problem List   Diagnosis Date Noted   Acute pain of right shoulder 01/26/2022   Vitamin D deficiency 08/20/2021   Multiple joint pain 08/19/2021   Obesity 01/30/2020   PVC (premature ventricular contraction) 06/15/2017   Hyperlipidemia 02/23/2017   Systolic murmur 02/23/2017   Hypertension 01/12/2017    Past Surgical History:  Procedure Laterality Date   BREAST BIOPSY Right 2016   benign   CESAREAN SECTION  1981, 1983   TOTAL ABDOMINAL HYSTERECTOMY  age 28    OB History     Gravida  3   Para  3   Term      Preterm      AB  0   Living  2      SAB  0   IAB      Ectopic      Multiple      Live Births           Obstetric Comments  Age first menstrual period 58 Age first pregnancy 64          Home Medications    Prior to Admission medications    Medication Sig Start Date End Date Taking? Authorizing Provider  lidocaine (XYLOCAINE) 2 % solution Use as directed 15 mLs in the mouth or throat every 3 (three) hours as needed for mouth pain. 12/16/22  Yes Nicosha Struve, DO  amLODipine (NORVASC) 10 MG tablet Take 1 tablet (10 mg total) by mouth daily. 08/19/21   Cannady, Corrie Dandy T, NP  celecoxib (CELEBREX) 100 MG capsule TAKE 1 CAPSULE BY MOUTH ONCE DAILY AS NEEDED 12/02/22   Cannady, Corrie Dandy T, NP  chlorthalidone (HYGROTON) 25 MG tablet Take 1 tablet (25 mg total) by mouth daily. 08/19/21   Cannady, Corrie Dandy T, NP  Cholecalciferol 1.25 MG (50000 UT) TABS Take 1 tablet by mouth once a week. 08/20/21   Cannady, Corrie Dandy T, NP  lisinopril (ZESTRIL) 40 MG tablet Take 1 tablet (40 mg total) by mouth daily. 08/19/21   Cannady, Corrie Dandy T, NP  Methylcobalamin (B-12) 1000 MCG TBDP Take 2 capsules by mouth daily.    [provider]  metroNIDAZOLE (FLAGYL) 500 MG tablet Take 1 tablet (500 mg total) by mouth 2 (two) times daily. Patient not taking: Reported on  08/07/2022 03/30/22   Olevia Perches P, DO  naproxen (NAPROSYN) 500 MG tablet Take 1 tablet (500 mg total) by mouth 2 (two) times daily with a meal. 07/26/22   Jamarrius Salay, DO  rosuvastatin (CRESTOR) 10 MG tablet Take 1 tablet (10 mg total) by mouth daily. 08/20/21   Cannady, Corrie Dandy T, NP  tiZANidine (ZANAFLEX) 4 MG tablet TAKE 1 TABLET BY MOUTH EVERY 6 HOURS AS NEEDED FOR MUSCLE SPASM 03/19/22   Aura Dials T, NP    Family History Family History  Problem Relation Age of Onset   Hypertension Mother    Hypertension Father    Heart murmur Father    Hypertension Daughter    Lupus Paternal Aunt     Social History Social History   Tobacco Use   Smoking status: Some Days    Types: Cigarettes   Smokeless tobacco: Never   Tobacco comments:    1 pack per week  Vaping Use   Vaping status: Never Used  Substance Use Topics   Alcohol use: Yes    Comment: occasionally   Drug use: No      Allergies   Patient has no known allergies.   Review of Systems Review of Systems: negative unless otherwise stated in HPI.      Physical Exam Triage Vital Signs ED Triage Vitals  Encounter Vitals Group     BP      Systolic BP Percentile      Diastolic BP Percentile      Pulse      Resp      Temp      Temp src      SpO2      Weight      Height      Head Circumference      Peak Flow      Pain Score      Pain Loc      Pain Education      Exclude from Growth Chart    No data found.  Updated Vital Signs BP (!) 181/112 (BP Location: Left Arm)   Pulse 83   Temp 99 F (37.2 C) (Oral)   Resp 17   LMP  (LMP Unknown)   SpO2 98%   Visual Acuity Right Eye Distance:   Left Eye Distance:   Bilateral Distance:    Right Eye Near:   Left Eye Near:    Bilateral Near:     Physical Exam GEN:     alert, non-toxic appearing older female in no distress    HENT:  mucus membranes moist, oropharyngeal without lesions, mild erythema, no tonsillar hypertrophy or exudates,  clear nasal discharge, bilateral TM normal EYES:   pupils equal and reactive, no scleral injection or discharge NECK:  normal ROM, anterior and posterior lymphadenopathy, no meningismus   RESP:  no increased work of breathing, clear to auscultation bilaterally CVS:   regular rate and rhythm, no JVP, no LE edema, Skin:   warm and dry, no rash on visible skin    UC Treatments / Results  Labs (all labs ordered are listed, but only abnormal results are displayed) Labs Reviewed  BASIC METABOLIC PANEL - Abnormal; Notable for the following components:      Result Value   Sodium 133 (*)    Chloride 96 (*)    BUN 6 (*)    All other components within normal limits  GROUP A STREP BY PCR  MONONUCLEOSIS SCREEN    EKG   Radiology  No results found.  Procedures Procedures (including critical care time)  Medications Ordered in UC Medications  lidocaine (XYLOCAINE) 2 % viscous mouth solution 15 mL  (15 mLs Mouth/Throat Given 12/16/22 0959)    Initial Impression / Assessment and Plan / UC Course  I have reviewed the triage vital signs and the nursing notes.  Pertinent labs & imaging results that were available during my care of the patient were reviewed by me and considered in my medical decision making (see chart for details).       Pt is a 62 y.o. female who presents for 2 days of sore throat. Nykerria is afebrile here without recent antipyretics. Satting well on room air. Overall pt is well appearing, well hydrated, without respiratory distress.  On exam, she has some mild erythema noted but no tonsillar hypertrophy or exudates.  Strep testing obtained and was negative. History consistent with viral respiratory illness.  Patient insistent on knowing why she has a sore throat therefore mono test was obtained.  This was also negative.  Discussed symptomatic treatment.  Explained lack of efficacy of antibiotics in viral disease.  Typical duration of symptoms discussed.  Given lidocaine solution with some relief and this was also prescribed.  Anabell with hypertensive urgency here.  BMP showing hypochloremic hyponatremia however her serum creatinine is normal.  No change in serum creatinine from previous March 2023 labs.  EKG showing sinus rhythm with some PVCs and unchanged anterior ischemia compared to August 24, 2019.  No acute ST or T wave changes.  Cardiopulmonary exam is normal.  BP 188/110 then after sitting was 189/114.  I suspect high blood pressure issue is secondary to medication nonadherence. Takes chlorthalidone, amlodine and lisinopril.  Discussed the dangers of elevated blood pressure over time.  Denies needing refills. Recommended she check herblood pressure and follow up with her primary care provider in the next 2 weeks.    Return and ED precautions given and voiced understanding. Discussed MDM, treatment plan and plan for follow-up with patient who agrees with plan.      Final Clinical Impressions(s) / UC Diagnoses   Final diagnoses:  Viral pharyngitis  Hypertensive urgency     Discharge Instructions      Your strep and monotest were normal.  Your blood pressure is absolutely too high.  Be sure to take your blood pressure medications regularly and keep a log for your primary care provider.  Follow-up with them in 2 weeks time take your log with you.  Kidney function is normal.  You did have slightly low salt levels.  Be sure to eat some salty food over the next day.   As you declined a COVID test here be sure to take 1 at home.       ED Prescriptions     Medication Sig Dispense Auth. Provider   lidocaine (XYLOCAINE) 2 % solution Use as directed 15 mLs in the mouth or throat every 3 (three) hours as needed for mouth pain. 100 mL Katha Cabal, DO      PDMP not reviewed this encounter.   Katha Cabal, DO 12/17/22 4401

## 2022-12-29 ENCOUNTER — Ambulatory Visit: Payer: Self-pay | Admitting: Nurse Practitioner

## 2022-12-31 ENCOUNTER — Other Ambulatory Visit: Payer: Self-pay | Admitting: Nurse Practitioner

## 2022-12-31 NOTE — Telephone Encounter (Signed)
Requested Prescriptions  Pending Prescriptions Disp Refills   celecoxib (CELEBREX) 100 MG capsule [Pharmacy Med Name: Celecoxib 100 MG Oral Capsule] 30 capsule 0    Sig: TAKE 1 CAPSULE BY MOUTH ONCE DAILY AS NEEDED     Analgesics:  COX2 Inhibitors Failed - 12/31/2022  9:13 AM      Failed - Manual Review: Labs are only required if the patient has taken medication for more than 8 weeks.      Failed - HGB in normal range and within 360 days    Hemoglobin  Date Value Ref Range Status  08/19/2021 13.7 11.1 - 15.9 g/dL Final         Failed - HCT in normal range and within 360 days    Hematocrit  Date Value Ref Range Status  08/19/2021 41.2 34.0 - 46.6 % Final         Failed - AST in normal range and within 360 days    AST  Date Value Ref Range Status  08/19/2021 26 0 - 40 IU/L Final         Failed - ALT in normal range and within 360 days    ALT  Date Value Ref Range Status  08/19/2021 22 0 - 32 IU/L Final         Passed - Cr in normal range and within 360 days    Creatinine, Ser  Date Value Ref Range Status  12/16/2022 0.72 0.44 - 1.00 mg/dL Final         Passed - eGFR is 30 or above and within 360 days    GFR calc Af Amer  Date Value Ref Range Status  06/15/2017 111 >59 mL/min/1.73 Final   GFR, Estimated  Date Value Ref Range Status  12/16/2022 >60 >60 mL/min Final    Comment:    (NOTE) Calculated using the CKD-EPI Creatinine Equation (2021)    eGFR  Date Value Ref Range Status  08/19/2021 94 >59 mL/min/1.73 Final         Passed - Patient is not pregnant      Passed - Valid encounter within last 12 months    Recent Outpatient Visits           9 months ago BV (bacterial vaginosis)   [place] [facility] [place], Megan P, DO   11 months ago Acute pain of right shoulder   [place] [facility] [place], Corrie Dandy T, NP   1 year ago Multiple joint pain   [place] [facility] [place], Corrie Dandy T, NP   1 year ago  Primary hypertension   [place] [facility] [place], [place] T, NP   2 years ago Essential hypertension   [place] [facility] [place], Dorie Rank, NP       Future Appointments             In 2 weeks Cannady, Dorie Rank, NP [place] [facility]

## 2023-01-18 ENCOUNTER — Ambulatory Visit: Payer: Self-pay | Admitting: Nurse Practitioner

## 2023-02-02 ENCOUNTER — Ambulatory Visit: Payer: Self-pay | Admitting: Nurse Practitioner

## 2023-04-30 ENCOUNTER — Ambulatory Visit: Payer: Self-pay

## 2023-04-30 ENCOUNTER — Ambulatory Visit
Admission: EM | Admit: 2023-04-30 | Discharge: 2023-04-30 | Disposition: A | Discharge status: Home / Self Care | Payer: Self-pay | Attending: Family Medicine | Admitting: Family Medicine

## 2023-04-30 DIAGNOSIS — I10 Essential (primary) hypertension: Secondary | ICD-10-CM

## 2023-04-30 DIAGNOSIS — R202 Paresthesia of skin: Secondary | ICD-10-CM

## 2023-04-30 DIAGNOSIS — M25512 Pain in left shoulder: Secondary | ICD-10-CM

## 2023-04-30 MED ORDER — PREDNISONE 10 MG (21) PO TBPK: ORAL_TABLET | Freq: Every day | ORAL | 0 refills | Status: DC

## 2023-04-30 MED ORDER — NAPROXEN 500 MG PO TABS: 500.0000 mg | ORAL_TABLET | Freq: Two times a day (BID) | ORAL | 0 refills | Status: DC

## 2023-04-30 MED ORDER — TIZANIDINE HCL 4 MG PO TABS: 4.0000 mg | ORAL_TABLET | Freq: Three times a day (TID) | ORAL | 0 refills | Status: DC | PRN

## 2023-04-30 NOTE — ED Triage Notes (Signed)
Pt c/o left shoulder pain x3days  Pt is unsure if she pulled a muscle.  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   Pt states that her arm is inflamed and has pain in the upper back and neck.  Pt denies nausea, stomach pain, headaches, or tingling in her hand.   Pt states that her arm feels numb sometimes.  Pt has been using Celebrex and naproxen and states that it is not helping.

## 2023-04-30 NOTE — ED Provider Notes (Signed)
[facility]    CSN: [id number] Arrival date & time: 04/30/23  4098      History   Chief Complaint Chief Complaint  Patient presents with   Arm Pain    HPI  HPI Allison Reynolds is a 62 y.o. female.   Allison Reynolds presents for throbbing left shoulder pain that radiates to neck and upper back.  Pain started about 3 days ago.  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  No chest pain, shortness of breath, nausea, vomiting, abdominal pain jaw pain or numbness.  Has tingling and numbness down her left arm. She is concerned this is related to her heart.  Taking Celebrex and Naprosyn for the past day with mild relief.      Past Medical History:  Diagnosis Date   Arthritis    Heart murmur    Hypertension    Thyroid disease     Patient Active Problem List   Diagnosis Date Noted   Vitamin D deficiency 08/20/2021   Multiple joint pain 08/19/2021   Obesity 01/30/2020   PVC (premature ventricular contraction) 06/15/2017   Hyperlipidemia 02/23/2017   Systolic murmur 02/23/2017   Hypertension 01/12/2017    Past Surgical History:  Procedure Laterality Date   BREAST BIOPSY Right 2016   benign   CESAREAN SECTION  1981, 1983   TOTAL ABDOMINAL HYSTERECTOMY  age 66    OB History     Gravida  3   Para  3   Term      Preterm      AB  0   Living  2      SAB  0   IAB      Ectopic      Multiple      Live Births           Obstetric Comments  Age first menstrual period 37 Age first pregnancy 54          Home Medications    Prior to Admission medications   Medication Sig Start Date End Date Taking? Authorizing Provider  amLODipine (NORVASC) 10 MG tablet Take 1 tablet (10 mg total) by mouth daily. 08/19/21  Yes Cannady, Jolene T, NP  chlorthalidone (HYGROTON) 25 MG tablet Take 1 tablet (25 mg total) by mouth daily. 08/19/21  Yes Cannady, Jolene T, NP  lisinopril (ZESTRIL) 40 MG tablet Take 1  tablet (40 mg total) by mouth daily. 08/19/21  Yes Cannady, Jolene T, NP  predniSONE (STERAPRED UNI-PAK 21 TAB) 10 MG (21) TBPK tablet Take by mouth daily. Take 6 tabs by mouth daily for 1, then 5 tabs for 1 day, then 4 tabs for 1 day, then 3 tabs for 1 day, then 2 tabs for 1 day, then 1 tab for 1 day. 04/30/23  Yes Roshini Fulwider, DO  Cholecalciferol 1.25 MG (50000 UT) TABS Take 1 tablet by mouth once a week. 08/20/21   Cannady, Corrie Dandy T, NP  lidocaine (XYLOCAINE) 2 % solution Use as directed 15 mLs in the mouth or throat every 3 (three) hours as needed for mouth pain. 12/16/22   Katha Cabal, DO  Methylcobalamin (B-12) 1000 MCG TBDP Take 2 capsules by mouth daily.    [provider]  naproxen (NAPROSYN) 500 MG tablet Take 1 tablet (500 mg total) by mouth 2 (two) times daily with a meal. 04/30/23   Berenize Gatlin, DO  rosuvastatin (CRESTOR) 10 MG tablet Take  1 tablet (10 mg total) by mouth daily. 08/20/21   Cannady, Corrie Dandy T, NP  tiZANidine (ZANAFLEX) 4 MG tablet Take 1 tablet (4 mg total) by mouth every 8 (eight) hours as needed for muscle spasms. 04/30/23   Katha Cabal, DO    Family History Family History  Problem Relation Age of Onset   Hypertension Mother    Hypertension Father    Heart murmur Father    Hypertension Daughter    Lupus Paternal Aunt     Social History Social History   Tobacco Use   Smoking status: Some Days    Types: Cigarettes   Smokeless tobacco: Never   Tobacco comments:    1 pack per week  Vaping Use   Vaping status: Never Used  Substance Use Topics   Alcohol use: Yes    Comment: occasionally   Drug use: No     Allergies   Patient has no known allergies.   Review of Systems Review of Systems: :negative unless otherwise stated in HPI.      Physical Exam Triage Vital Signs ED Triage Vitals  Encounter Vitals Group     BP 04/30/23 1034 (!) 159/69     Systolic BP Percentile --      Diastolic BP Percentile --      Pulse Rate  04/30/23 1034 70     Resp --      Temp 04/30/23 1034 97.7 F (36.5 C)     Temp Source 04/30/23 1034 Oral     SpO2 04/30/23 1034 98 %     Weight 04/30/23 1031 182 lb (82.6 kg)     Height 04/30/23 1031 5\' 6"  (1.676 m)     Head Circumference --      Peak Flow --      Pain Score 04/30/23 1031 8     Pain Loc --      Pain Education --      Exclude from Growth Chart --    No data found.  Updated Vital Signs BP (!) 159/69 (BP Location: Right Arm)   Pulse 70   Temp 97.7 F (36.5 C) (Oral)   Ht 5\' 6"  (1.676 m)   Wt 82.6 kg   LMP  (LMP Unknown)   SpO2 98%   BMI 29.38 kg/m   Visual Acuity Right Eye Distance:   Left Eye Distance:   Bilateral Distance:    Right Eye Near:   Left Eye Near:    Bilateral Near:     Physical Exam GEN: well appearing older female in no acute distress  NECK: No midline C-spine tenderness, left-sided hypertonicity of paraspinal and trapezius muscles,  CVS: well perfused, regular rate and rhythm, no JVP RESP: speaking in full sentences without pause, no respiratory distress clear to auscultation bilaterally MSK:  Left shoulder:  No evidence of bony deformity, asymmetry, or muscle atrophy. No tenderness over long head of biceps (bicipital groove).  No TTP at [facility] joint.  Good active and passive (ABD, ADD, Flexion, extension, IR, ER).  Strength 5/5 grip, elbow and shoulder.  No abnormal scapular function observed.  Special Tests: Hawkins: Negative; Empty Can: Negative, Neer's: Negative; Painful arc: Negative; Anterior Apprehension: Negative Sensation intact. Peripheral pulses intact. Skin: Warm and dry, well-perfused  UC Treatments / Results  Labs (all labs ordered are listed, but only abnormal results are displayed) Labs Reviewed - No data to display  EKG   Radiology DG Shoulder Left  Result Date: 04/30/2023 CLINICAL DATA:  Shoulder pain for  3 days.  Possible pulled muscle. EXAM: LEFT SHOULDER - 2+ VIEW COMPARISON:  None Available. FINDINGS:  The mineralization and alignment are normal. There is no evidence of acute fracture or dislocation. Mild acromioclavicular and glenohumeral degenerative changes. The soft tissues appear unremarkable. No significant narrowing of the subacromial space. IMPRESSION: Mild acromioclavicular and glenohumeral degenerative changes. No acute osseous findings. Electronically Signed   By: Carey Bullocks M.D.   On: 04/30/2023 11:33     Procedures Procedures (including critical care time)  Medications Ordered in UC Medications - No data to display  Initial Impression / Assessment and Plan / UC Course  I have reviewed the triage vital signs and the nursing notes.  Pertinent labs & imaging results that were available during my care of the patient were reviewed by me and considered in my medical decision making (see chart for details).      Pt is a 62 y.o.  female with 3 days of left shoulder pain that radiates to her neck and down her arm.    EKG performed at patient's request.  EKG showing,'s rhythm with anterior ischemia but this is not new for her EKG on 12/16/2022.  Reassurance provided.  Suspect patient has a arthritic flareup but I do not see any imaging in her chart for her left shoulder.  She has been maneuvering a obese patient.  Her exam is unremarkable.  Obtained left shoulder plain films.  Personally interpreted by me were unremarkable for fracture or dislocation. Radiologist report reviewed and additionally notes mild before meals and GH degenerative changes.   Patient to gradually return to normal activities, as tolerated and continue ordinary activities within the limits permitted by pain. Prescribed prednisone taper, naproxen sodium  and muscle relaxer for pain relief.  Tylenol PRN. Advised patient to avoid OTC NSAIDs while taking prescription NSAID.   Patient to follow up with orthopedic provider, if symptoms do not improve with conservative treatment.  Return and ED precautions given.  Understanding voiced. Discussed MDM, treatment plan and plan for follow-up with patient who agrees with plan.   Final Clinical Impressions(s) / UC Diagnoses   Final diagnoses:  Acute pain of left shoulder  Tingling of left upper extremity  Elevated blood pressure reading with diagnosis of hypertension     Discharge Instructions      You have some age related changed but no broken or dislocated bones.  Take the medications as prescribed.  Follow up with [facility], if pain is not improving.  Go to the emergency department/ER if you develop chest pain or shortness of breath.      ED Prescriptions     Medication Sig Dispense Auth. Provider   naproxen (NAPROSYN) 500 MG tablet Take 1 tablet (500 mg total) by mouth 2 (two) times daily with a meal. 30 tablet Coburn Knaus, DO   tiZANidine (ZANAFLEX) 4 MG tablet Take 1 tablet (4 mg total) by mouth every 8 (eight) hours as needed for muscle spasms. 30 tablet Iliza Blankenbeckler, DO   predniSONE (STERAPRED UNI-PAK 21 TAB) 10 MG (21) TBPK tablet Take by mouth daily. Take 6 tabs by mouth daily for 1, then 5 tabs for 1 day, then 4 tabs for 1 day, then 3 tabs for 1 day, then 2 tabs for 1 day, then 1 tab for 1 day. 21 tablet Katha Cabal, DO      PDMP not reviewed this encounter.   Katha Cabal, DO 05/03/23 1057

## 2023-04-30 NOTE — Discharge Instructions (Addendum)
You have some age related changed but no broken or dislocated bones.  Take the medications as prescribed.  Follow up with [facility], if pain is not improving.  Go to the emergency department/ER if you develop chest pain or shortness of breath.

## 2023-07-04 NOTE — Patient Instructions (Signed)

## 2023-07-07 ENCOUNTER — Ambulatory Visit
Admission: RE | Admit: 2023-07-07 | Discharge: 2023-07-07 | Disposition: A | Discharge status: Home / Self Care | Payer: Self-pay | Source: Home / Self Care | Attending: Nurse Practitioner | Admitting: Nurse Practitioner

## 2023-07-07 ENCOUNTER — Ambulatory Visit
Admission: RE | Admit: 2023-07-07 | Discharge: 2023-07-07 | Disposition: A | Discharge status: Home / Self Care | Payer: Self-pay | Source: Ambulatory Visit | Attending: Nurse Practitioner | Admitting: Nurse Practitioner

## 2023-07-07 ENCOUNTER — Ambulatory Visit (INDEPENDENT_AMBULATORY_CARE_PROVIDER_SITE_OTHER): Payer: Self-pay | Admitting: Nurse Practitioner

## 2023-07-07 ENCOUNTER — Encounter: Payer: Self-pay | Admitting: Nurse Practitioner

## 2023-07-07 VITALS — BP 142/86 | HR 76 | Temp 97.8°F | Ht 65.0 in | Wt 185.6 lb

## 2023-07-07 DIAGNOSIS — R011 Cardiac murmur, unspecified: Secondary | ICD-10-CM

## 2023-07-07 DIAGNOSIS — E66811 Obesity, class 1: Secondary | ICD-10-CM

## 2023-07-07 DIAGNOSIS — M25561 Pain in right knee: Secondary | ICD-10-CM | POA: Insufficient documentation

## 2023-07-07 DIAGNOSIS — G8929 Other chronic pain: Secondary | ICD-10-CM

## 2023-07-07 DIAGNOSIS — Z6831 Body mass index (BMI) 31.0-31.9, adult: Secondary | ICD-10-CM

## 2023-07-07 DIAGNOSIS — E782 Mixed hyperlipidemia: Secondary | ICD-10-CM

## 2023-07-07 DIAGNOSIS — I10 Essential (primary) hypertension: Secondary | ICD-10-CM

## 2023-07-07 DIAGNOSIS — E6609 Other obesity due to excess calories: Secondary | ICD-10-CM

## 2023-07-07 MED ORDER — LISINOPRIL 40 MG PO TABS: 40.0000 mg | ORAL_TABLET | Freq: Every day | ORAL | 4 refills | Status: DC

## 2023-07-07 MED ORDER — AMLODIPINE BESYLATE 10 MG PO TABS: 10.0000 mg | ORAL_TABLET | Freq: Every day | ORAL | 4 refills | Status: DC

## 2023-07-07 MED ORDER — ROSUVASTATIN CALCIUM 10 MG PO TABS: 10.0000 mg | ORAL_TABLET | Freq: Every day | ORAL | 3 refills | Status: DC

## 2023-07-07 MED ORDER — CHLORTHALIDONE 25 MG PO TABS: 25.0000 mg | ORAL_TABLET | Freq: Every day | ORAL | 4 refills | Status: DC

## 2023-07-07 MED ORDER — HYDROCODONE-ACETAMINOPHEN 5-325 MG PO TABS: 1.0000 | ORAL_TABLET | Freq: Four times a day (QID) | ORAL | 0 refills | Status: AC | PRN

## 2023-07-07 NOTE — Assessment & Plan Note (Signed)
 Chronic, ongoing.  BP slightly elevated today due to pain and missing medication doses.  Recommend she monitor BP at least a few mornings a week at home and document.  DASH diet at home.  Continue current medication regimen and adjust as needed, refills sent in and to take consistently. She goes on Medicare in August, will obtain labs then to reduce cost to patient.

## 2023-07-07 NOTE — Assessment & Plan Note (Signed)
BMI 30.89.  Recommended eating smaller high protein, low fat meals more frequently and exercising 30 mins a day 5 times a week with a goal of 10-15lb weight loss in the next 3 months. Patient voiced their understanding and motivation to adhere to these recommendations.

## 2023-07-07 NOTE — Assessment & Plan Note (Signed)
Stable, asymptomatic.  Will obtain echo if symptoms present. ?

## 2023-07-07 NOTE — Progress Notes (Signed)
 BP (!) 142/86 (BP Location: Left Arm, Patient Position: Sitting, Cuff Size: Large) Comment: not taken medication yet  Pulse 76   Temp 97.8 F (36.6 C) (Oral)   Ht 5' 5 (1.651 m)   Wt 185 lb 9.6 oz (84.2 kg)   LMP  (LMP Unknown)   SpO2 98%   BMI 30.89 kg/m    Subjective:    Patient ID: Allison Reynolds, female    DOB: 13-Mar-1961, 63 y.o.   MRN: [id number]  HPI: Allison Reynolds is a 63 y.o. female  Chief Complaint  Patient presents with   Hyperlipidemia   Hypertension   Knee Pain    Patient states she has been having pain in her R knee for the last 2 months. States she feels the pain sometimes all the way down to her foot. Patient states that the pain comes and goes.    HYPERTENSION / HYPERLIPIDEMIA Taking Amlodipine , Chlorthalidone , Lisinopril  + Rosuvastatin .  Has been out of some of the medications recently.  BP has been higher since having knee pain. Satisfied with current treatment? yes Duration of hypertension: chronic BP monitoring frequency: a few times a week BP range: past two days elevated due to pain BP medication side effects: no Duration of hyperlipidemia: chronic Cholesterol medication side effects: no Cholesterol supplements: none Medication compliance: good compliance Aspirin: no Recent stressors: no Recurrent headaches: no Visual changes: no Palpitations: no Dyspnea: no Chest pain: no Lower extremity edema: no Dizzy/lightheaded: no   KNEE PAIN (RIGHT) Started having knee pain 2 months ago, worse with the cold weather.  Has been limping on occasion. Duration: months Involved knee: right Mechanism of injury: unknown Location:lateral Onset: gradual Severity: 10/10  Quality:  sharp, aching, and throbbing Frequency: constant although sometimes better Radiation:  at times down to foot Aggravating factors: weight bearing, walking, bending, and movement  Alleviating factors: nothing Status: fluctuating Treatments attempted: rest, heat, APAP, and  ibuprofen , Voltaren  gel, sleeve Relief with NSAIDs?:  mild Weakness with weight bearing or walking: yes Sensation of giving way: yes Locking: no Popping: no Bruising: no Swelling: yes Redness: no Paresthesias/decreased sensation: no Fevers: no   Relevant past medical, surgical, family and social history reviewed and updated as indicated. Interim medical history since our last visit reviewed. Allergies and medications reviewed and updated.  Review of Systems  Constitutional:  Negative for activity change, appetite change, diaphoresis, fatigue and fever.  Respiratory:  Negative for cough, chest tightness and shortness of breath.   Cardiovascular:  Negative for chest pain, palpitations and leg swelling.  Gastrointestinal: Negative.   Endocrine: Negative for cold intolerance, heat intolerance, polydipsia, polyphagia and polyuria.  Musculoskeletal:  Positive for arthralgias.  Neurological:  Negative for dizziness, syncope, weakness, light-headedness, numbness and headaches.  Psychiatric/Behavioral: Negative.     Per HPI unless specifically indicated above     Objective:    BP (!) 142/86 (BP Location: Left Arm, Patient Position: Sitting, Cuff Size: Large) Comment: not taken medication yet  Pulse 76   Temp 97.8 F (36.6 C) (Oral)   Ht 5' 5 (1.651 m)   Wt 185 lb 9.6 oz (84.2 kg)   LMP  (LMP Unknown)   SpO2 98%   BMI 30.89 kg/m   Wt Readings from Last 3 Encounters:  07/07/23 185 lb 9.6 oz (84.2 kg)  04/30/23 182 lb (82.6 kg)  07/26/22 190 lb 11.2 oz (86.5 kg)    Physical Exam Vitals and nursing note reviewed.  Constitutional:      General: She  is awake. She is not in acute distress.    Appearance: She is well-developed and well-groomed. She is obese. She is not ill-appearing or toxic-appearing.  HENT:     Head: Normocephalic.     Right Ear: Hearing and external ear normal.     Left Ear: Hearing and external ear normal.  Eyes:     General: Lids are normal.        Right  eye: No discharge.        Left eye: No discharge.     Conjunctiva/sclera: Conjunctivae normal.     Pupils: Pupils are equal, round, and reactive to light.  Neck:     Thyroid : No thyromegaly.     Vascular: No carotid bruit.  Cardiovascular:     Rate and Rhythm: Normal rate and regular rhythm.     Heart sounds: Murmur heard.     Systolic murmur is present with a grade of 2/6.     No gallop.  Pulmonary:     Effort: Pulmonary effort is normal. No accessory muscle usage or respiratory distress.     Breath sounds: Normal breath sounds.  Abdominal:     General: Bowel sounds are normal. There is no distension.     Palpations: Abdomen is soft.     Tenderness: There is no abdominal tenderness.  Musculoskeletal:     Cervical back: Normal range of motion and neck supple.     Right knee: Swelling (very mild to anterior lateral) and crepitus present. No erythema or bony tenderness. Normal range of motion. Tenderness present over the lateral joint line. Normal meniscus and normal patellar mobility.     Instability Tests: Anterior drawer test negative. Posterior drawer test negative. Medial McMurray test negative and lateral McMurray test negative.     Left knee: Normal.     Right lower leg: No edema.     Left lower leg: No edema.     Comments: Right knee discomfort with flexion and extension present.  None with lateral or medial movement.  Lymphadenopathy:     Cervical: No cervical adenopathy.  Skin:    General: Skin is warm and dry.  Neurological:     Mental Status: She is alert and oriented to person, place, and time.     Deep Tendon Reflexes: Reflexes are normal and symmetric.     Reflex Scores:      Brachioradialis reflexes are 2+ on the right side and 2+ on the left side.      Patellar reflexes are 2+ on the right side and 2+ on the left side. Psychiatric:        Attention and Perception: Attention normal.        Mood and Affect: Mood normal.        Speech: Speech normal.         Behavior: Behavior normal. Behavior is cooperative.        Thought Content: Thought content normal.     Results for orders placed or performed during the hospital encounter of 12/16/22  Group A Strep by PCR   Collection Time: 12/16/22  8:45 AM   Specimen: Throat; Sterile Swab  Result Value Ref Range   Group A Strep by PCR NOT DETECTED NOT DETECTED  Basic metabolic panel   Collection Time: 12/16/22 10:01 AM  Result Value Ref Range   Sodium 133 (L) 135 - 145 mmol/L   Potassium 3.9 3.5 - 5.1 mmol/L   Chloride 96 (L) 98 - 111 mmol/L   CO2  28 22 - 32 mmol/L   Glucose, Bld 98 70 - 99 mg/dL   BUN 6 (L) 8 - 23 mg/dL   Creatinine, Ser 9.27 0.44 - 1.00 mg/dL   Calcium  9.2 8.9 - 10.3 mg/dL   GFR, Estimated >39 >39 mL/min   Anion gap 9 5 - 15  Mononucleosis screen   Collection Time: 12/16/22 10:01 AM  Result Value Ref Range   Mono Screen NEGATIVE NEGATIVE      Assessment & Plan:   Problem List Items Addressed This Visit       Cardiovascular and Mediastinum   Hypertension   Chronic, ongoing.  BP slightly elevated today due to pain and missing medication doses.  Recommend she monitor BP at least a few mornings a week at home and document.  DASH diet at home.  Continue current medication regimen and adjust as needed, refills sent in and to take consistently. She goes on Medicare in August, will obtain labs then to reduce cost to patient.       Relevant Medications   amLODipine  (NORVASC ) 10 MG tablet   chlorthalidone  (HYGROTON ) 25 MG tablet   lisinopril  (ZESTRIL ) 40 MG tablet   rosuvastatin  (CRESTOR ) 10 MG tablet     Other   Chronic pain of right knee - Primary   Ongoing for >2 months and worsening per patient.  At this time will obtain imaging of knee, suspect some OA present.  She has tried all OTC methods and support sleeve with no benefit. Send in Norco to take only as needed, discussed at length with her to use sparsely and continue OTC methods at home.  Will plan on knee  steroid injection after imaging if OA present and ongoing pain.  Discussed plan with patient.      Relevant Orders   DG KNEE 3 VIEW RIGHT   Hyperlipidemia   Ongoing, recommend she restart Rosuvastatin  as ordered.  She goes on Medicare in August, will obtain labs then to reduce cost to patient.      Relevant Medications   amLODipine  (NORVASC ) 10 MG tablet   chlorthalidone  (HYGROTON ) 25 MG tablet   lisinopril  (ZESTRIL ) 40 MG tablet   rosuvastatin  (CRESTOR ) 10 MG tablet   Obesity   BMI 30.89.  Recommended eating smaller high protein, low fat meals more frequently and exercising 30 mins a day 5 times a week with a goal of 10-15lb weight loss in the next 3 months. Patient voiced their understanding and motivation to adhere to these recommendations.       Systolic murmur   Stable, asymptomatic.  Will obtain echo if symptoms present.        Follow up plan: Return in about 7 months (around 01/31/2024) for Annual Physical.

## 2023-07-07 NOTE — Assessment & Plan Note (Signed)
 Ongoing, recommend she restart Rosuvastatin  as ordered.  She goes on Medicare in August, will obtain labs then to reduce cost to patient.

## 2023-07-07 NOTE — Assessment & Plan Note (Addendum)
 Ongoing for >2 months and worsening per patient.  At this time will obtain imaging of knee, suspect some OA present.  She has tried all OTC methods and support sleeve with no benefit. Send in Norco to take only as needed, discussed at length with her to use sparsely and continue OTC methods at home.  Will plan on knee steroid injection after imaging if OA present and ongoing pain.  Discussed plan with patient.

## 2023-07-09 ENCOUNTER — Telehealth: Payer: Self-pay | Admitting: Nurse Practitioner

## 2023-07-09 NOTE — Telephone Encounter (Signed)
 Copied from CRM [id number]. Topic: General - Other >> Jul 09, 2023  2:29 PM Selinda RAMAN wrote: Reason for CRM: The patient called in stating she would like for someone to call her as soon as possible when results of her knee x rays are back as she is still hurting and needs to know what to do depending on what it is. Please assist patient further.

## 2023-07-09 NOTE — Telephone Encounter (Signed)
 Imaging has not been read by radiology yet.

## 2023-07-12 NOTE — Telephone Encounter (Signed)
 Called and notified patient of Jolene's message.

## 2023-07-15 NOTE — Progress Notes (Signed)
 Good morning, please let Allison Reynolds know her knee imaging returned and is showing no acute or chronic issues present at this time.  If still having pain we could consider knee steroid injection in office or I could place a referral to ortho for further recommendations.  Let me know which direction would like to take.  Thank you. Keep being stellar!!  Thank you for allowing me to participate in your care.  I appreciate you. Kindest regards, Arkeem Harts

## 2023-07-15 NOTE — Progress Notes (Signed)
 Pt given lab results per notes of Jolene Cannady NP on 07/15/23. Pt verbalized understanding. Pt stated the pain comes and goes. Hydrocodone  w/ acetaminophen  is not helping pain. Advised pt to start Arthritis strength acetaminophen  per package directions. Pt mentioned could it be a blood clot Denies skin feeling warmer or a knot. Pt has an appt Monday at 0830 w/ Dr Lynwood Satterfield orthopedic doctor at [facility].

## 2024-01-10 ENCOUNTER — Ambulatory Visit: Payer: Self-pay

## 2024-01-10 NOTE — Telephone Encounter (Addendum)
 FYI Only or Action Required?: FYI only for provider.  Patient was last seen in primary care on 07/07/2023 by Cannady, Jolene T, NP.  Called Nurse Triage reporting Leg Swelling.  Symptoms began several days ago.  Interventions attempted: Rest, hydration, or home remedies.  Symptoms are: gradually worsening.  Triage Disposition: See HCP Within 4 Hours (Or PCP Triage)  Patient/caregiver understands and will follow disposition?: Yes, will follow disposition  No appts available in clinic, pt advised to go to UC.   Reason for Disposition  [1] Thigh or calf pain AND [2] only 1 side AND [3] present > 1 hour  Answer Assessment - Initial Assessment Questions 1. ONSET: When did the swelling start? (e.g., minutes, hours, days)     About a week 2. LOCATION: What part of the leg is swollen?  Are both legs swollen or just one leg?     L leg 3. SEVERITY: How bad is the swelling? (e.g., localized; mild, moderate, severe)     mild 4. REDNESS: Is there redness or signs of infection?     denies 5. PAIN: Is the swelling painful to touch? If Yes, ask: How painful is it?   (Scale 1-10; mild, moderate or severe)     denies 6. FEVER: Do you have a fever? If Yes, ask: What is it, how was it measured, and when did it start?      denies 7. CAUSE: What do you think is causing the leg swelling?     Unsure, just want to make sure its not a clot 8. MEDICAL HISTORY: Do you have a history of blood clots (e.g., DVT), cancer, heart failure, kidney disease, or liver failure?     denies 9. RECURRENT SYMPTOM: Have you had leg swelling before? If Yes, ask: When was the last time? What happened that time?     denies 10. OTHER SYMPTOMS: Do you have any other symptoms? (e.g., chest pain, difficulty breathing)       denies 11. PREGNANCY: Is there any chance you are pregnant? When was your last menstrual period?       denies  Protocols used: Leg Swelling and Edema-A-AH

## 2024-01-13 ENCOUNTER — Ambulatory Visit
Admission: EM | Admit: 2024-01-13 | Discharge: 2024-01-13 | Disposition: A | Discharge status: Home / Self Care | Attending: Emergency Medicine | Admitting: Emergency Medicine

## 2024-01-13 ENCOUNTER — Encounter: Payer: Self-pay | Admitting: *Deleted

## 2024-01-13 ENCOUNTER — Telehealth: Payer: Self-pay

## 2024-01-13 DIAGNOSIS — R09A2 Foreign body sensation, throat: Secondary | ICD-10-CM

## 2024-01-13 NOTE — Telephone Encounter (Signed)
 Copied from CRM #[id number]. Topic: Clinical - Medical Advice >> Jan 13, 2024  8:51 AM Treva T wrote: Reason for CRM: Received call from patient, states she would like discuss having thyroid checked. Patient reports has occasional sweats, and would like lab tests drawn.  Offered next available appointment at time of call, 02/09/24, with provider, patient wanted to be seen sooner, requesting to speak with nurse.   Patient Can be reached at [phone].

## 2024-01-13 NOTE — Telephone Encounter (Signed)
 Do you want to use a same day appointment slot for this or wait until next available?

## 2024-01-13 NOTE — Discharge Instructions (Addendum)
 Your EKG was unremarkable and does not show any acute events.  It is unchanged from her last 2 previous EKGs in epic.  As we discussed, the globus sensation that you are feeling is most likely your thyroid enlarging due to your thyroid issues.  Keep your follow-up appointment with your primary care provider to discuss this ongoing issue and to have thyroid labs and possible imaging of your thyroid performed.  If you cannot wait for your upcoming appointment I suggest you call the office and see if you can be seen sooner.

## 2024-01-13 NOTE — ED Provider Notes (Signed)
 [facility]    CSN: [id number] Arrival date & time: 01/13/24  1821      History   Chief Complaint Chief Complaint  Patient presents with   throat issue    HPI Allison Reynolds is a 63 y.o. female.   HPI  63 year old female with past medical significant for thyroid disease, hypertension, heart murmur, and arthritis presents for evaluation of a clogged sensation in her throat that is present sometimes after eating.  She denies any choking sensation or having difficulty swallowing.  She reports that she is also been experiencing intermittent sweats and feeling hot.   Past Medical History:  Diagnosis Date   Arthritis    Heart murmur    Hypertension    Thyroid disease     Patient Active Problem List   Diagnosis Date Noted   Chronic pain of right knee 07/07/2023   Vitamin D  deficiency 08/20/2021   Multiple joint pain 08/19/2021   Obesity 01/30/2020   PVC (premature ventricular contraction) 06/15/2017   Hyperlipidemia 02/23/2017   Systolic murmur 02/23/2017   Hypertension 01/12/2017    Past Surgical History:  Procedure Laterality Date   BREAST BIOPSY Right 2016   benign   CESAREAN SECTION  1981, 1983   TOTAL ABDOMINAL HYSTERECTOMY  age 67    OB History     Gravida  3   Para  3   Term      Preterm      AB  0   Living  2      SAB  0   IAB      Ectopic      Multiple      Live Births           Obstetric Comments  Age first menstrual period 45 Age first pregnancy 63          Home Medications    Prior to Admission medications   Medication Sig Start Date End Date Taking? Authorizing Provider  amLODipine  (NORVASC ) 10 MG tablet Take 1 tablet (10 mg total) by mouth daily. 07/07/23   Cannady, Jolene T, NP  chlorthalidone  (HYGROTON ) 25 MG tablet Take 1 tablet (25 mg total) by mouth daily. 07/07/23   Cannady, Jolene T, NP  lisinopril  (ZESTRIL ) 40 MG tablet Take 1 tablet (40 mg total) by mouth daily. 07/07/23   Cannady, Jolene T, NP     Family History Family History  Problem Relation Age of Onset   Hypertension Mother    Hypertension Father    Heart murmur Father    Hypertension Daughter    Lupus Paternal Aunt     Social History Social History   Tobacco Use   Smoking status: Some Days    Types: Cigarettes   Smokeless tobacco: Never   Tobacco comments:    1 pack per week  Vaping Use   Vaping status: Never Used  Substance Use Topics   Alcohol use: Yes    Comment: occasionally   Drug use: No     Allergies   Patient has no known allergies.   Review of Systems Review of Systems  HENT:         Regarding the intermittent globus sensation.  Endocrine: Positive for heat intolerance.     Physical Exam Triage Vital Signs ED Triage Vitals  Encounter Vitals Group     BP      Girls Systolic BP Percentile      Girls Diastolic BP Percentile      Boys Systolic  BP Percentile      Boys Diastolic BP Percentile      Pulse      Resp      Temp      Temp src      SpO2      Weight      Height      Head Circumference      Peak Flow      Pain Score      Pain Loc      Pain Education      Exclude from Growth Chart    No data found.  Updated Vital Signs BP 106/70 (BP Location: Left Arm)   Pulse 61   Temp 97.8 F (36.6 C) (Oral)   Resp 20   Wt 180 lb (81.6 kg)   LMP  (LMP Unknown)   SpO2 97%   BMI 29.95 kg/m   Visual Acuity Right Eye Distance:   Left Eye Distance:   Bilateral Distance:    Right Eye Near:   Left Eye Near:    Bilateral Near:     Physical Exam Vitals and nursing note reviewed.  Constitutional:      Appearance: Normal appearance. She is not ill-appearing.  Neck:     Comments: Thyroid is palpable and mildly enlarged.  Glide smoothly up and down upper trachea with swallowing. Musculoskeletal:     Cervical back: Neck supple. No rigidity or tenderness.  Lymphadenopathy:     Cervical: No cervical adenopathy.  Skin:    General: Skin is warm and dry.     Capillary  Refill: Capillary refill takes less than 2 seconds.     Findings: No rash.  Neurological:     General: No focal deficit present.     Mental Status: She is alert and oriented to person, place, and time.      UC Treatments / Results  Labs (all labs ordered are listed, but only abnormal results are displayed) Labs Reviewed - No data to display  EKG Normal sinus rhythm with a ventricular rate of 62 bpm PR DeVeau 210 ms QRS duration 84 ms QT/QTc 418/4 and 24 ms T wave inversions in V1, V2, and V3.  Radiology No results found.  Procedures Procedures (including critical care time)  Medications Ordered in UC Medications - No data to display  Initial Impression / Assessment and Plan / UC Course  I have reviewed the triage vital signs and the nursing notes.  Pertinent labs & imaging results that were available during my care of the patient were reviewed by me and considered in my medical decision making (see chart for details).   Patient is a nontoxic-appearing 63 year old female who presents with a complaint of intermittent globus sensation in the front of her throat that occurs with eating.  She denies any choking sensation or having food getting caught or choking on fluids.  She does have a history of thyroid issues but she is not on any thyroid replacement medication.  She does have a mildly enlarged thyroid on physical exam and I suspect this is most likely what is causing her globus sensation.  Intermittently as well as her intermittent sweats and feeling hot.  She is requesting lab work and I informed her that we do not check thyroid labs here in urgent care because we do not manage thyroid issues.  She also requested an EKG at triage but denied chest pain.  She asked me for an EKG and I inquired if she was having  chest pain she reports that she has been having chest pain for 5 days with intermittent numbness in her left arm.  She reports that it is difficult to lay on her left arm.   She does not appear to be in any acute distress.  She verbalizes that she feels like she came to the urgent care for nothing.  I explained that I am sorry that we do not manage chronic illnesses here at urgent care we only manage acute issues.  However, given that she is not complaining of chest pain I will obtain an EKG.  Patient is EKG shows normal sinus rhythm with first-degree AV block as the PR interval is 202 ms.  The interpretation is reading ST and T wave abnormality, consider anterior ischemia.  This was been present on the last 2 EKGs available in epic.  There is no appreciable change to her EKG when compared to EKG dated 04/30/2023.  I will discharge the patient on the diagnosis of globus sensation and have her follow-up with her primary care provider for thyroid labs and for possible ultrasound of her thyroid.   Final Clinical Impressions(s) / UC Diagnoses   Final diagnoses:  Globus sensation     Discharge Instructions      Your EKG was unremarkable and does not show any acute events.  It is unchanged from her last 2 previous EKGs in epic.  As we discussed, the globus sensation that you are feeling is most likely your thyroid enlarging due to your thyroid issues.  Keep your follow-up appointment with your primary care provider to discuss this ongoing issue and to have thyroid labs and possible imaging of your thyroid performed.  If you cannot wait for your upcoming appointment I suggest you call the office and see if you can be seen sooner.     ED Prescriptions   None    PDMP not reviewed this encounter.   Bernardino Ditch, NP 01/13/24 1954

## 2024-01-13 NOTE — ED Triage Notes (Signed)
 Patient states feeling like something is stuck in throat sometimes after eating, she denies sore throat and denies feeling like throat is closing.  States she has a history of thyroid issues and feeling hot/sweaty intermittently.

## 2024-01-14 NOTE — Telephone Encounter (Signed)
 Please call and reschedule patient to a same day slot per Jolene.

## 2024-01-14 NOTE — Telephone Encounter (Signed)
 Scheduled

## 2024-01-22 NOTE — Patient Instructions (Signed)
 Edema  Edema is when you have too much fluid in your body or under your skin. Edema may make your legs, feet, and ankles swell. Swelling often happens in looser tissues, such as around your eyes. This is a common condition. It gets more common as you get older. There are many possible causes of edema. These include: Eating too much salt (sodium). Being on your feet or sitting for a long time. Certain medical conditions, such as: Pregnancy. Heart failure. Liver disease. Kidney disease. Cancer. Hot weather may make edema worse. Edema is usually painless. Your skin may look swollen or shiny. Follow these instructions at home: Medicines Take over-the-counter and prescription medicines only as told by your doctor. Your doctor may prescribe a medicine to help your body get rid of extra water (diuretic). Take this medicine if you are told to take it. Eating and drinking Eat a low-salt (low-sodium) diet as told by your doctor. Sometimes, eating less salt may reduce swelling. Depending on the cause of your swelling, you may need to limit how much fluid you drink (fluid restriction). General instructions Raise the injured area above the level of your heart while you are sitting or lying down. Do not sit still or stand for a long time. Do not wear tight clothes. Do not wear garters on your upper legs. Exercise your legs. This can help the swelling go down. Wear compression stockings as told by your doctor. It is important that these are the right size. These should be prescribed by your doctor to prevent possible injuries. If elastic bandages or wraps are recommended, use them as told by your doctor. Contact a doctor if: Treatment is not working. You have heart, liver, or kidney disease and have symptoms of edema. You have sudden and unexplained weight gain. Get help right away if: You have shortness of breath or chest pain. You cannot breathe when you lie down. You have pain, redness, or  warmth in the swollen areas. You have heart, liver, or kidney disease and get edema all of a sudden. You have a fever and your symptoms get worse all of a sudden. These symptoms may be an emergency. Get help right away. Call 911. Do not wait to see if the symptoms will go away. Do not drive yourself to the hospital. Summary Edema is when you have too much fluid in your body or under your skin. Edema may make your legs, feet, and ankles swell. Swelling often happens in looser tissues, such as around your eyes. Raise the injured area above the level of your heart while you are sitting or lying down. Follow your doctor's instructions about diet and how much fluid you can drink. This information is not intended to replace advice given to you by your health care provider. Make sure you discuss any questions you have with your health care provider. Document Revised: 01/20/2021 Document Reviewed: 01/20/2021 Elsevier Patient Education  2024 [employer].

## 2024-01-25 ENCOUNTER — Ambulatory Visit (INDEPENDENT_AMBULATORY_CARE_PROVIDER_SITE_OTHER): Admitting: Nurse Practitioner

## 2024-01-25 ENCOUNTER — Encounter: Payer: Self-pay | Admitting: Nurse Practitioner

## 2024-01-25 VITALS — BP 126/76 | HR 73 | Temp 98.7°F | Resp 15 | Ht 65.0 in | Wt 181.6 lb

## 2024-01-25 DIAGNOSIS — E782 Mixed hyperlipidemia: Secondary | ICD-10-CM

## 2024-01-25 DIAGNOSIS — E6609 Other obesity due to excess calories: Secondary | ICD-10-CM

## 2024-01-25 DIAGNOSIS — M7989 Other specified soft tissue disorders: Secondary | ICD-10-CM | POA: Insufficient documentation

## 2024-01-25 DIAGNOSIS — Z6831 Body mass index (BMI) 31.0-31.9, adult: Secondary | ICD-10-CM

## 2024-01-25 DIAGNOSIS — I10 Essential (primary) hypertension: Secondary | ICD-10-CM

## 2024-01-25 DIAGNOSIS — E559 Vitamin D deficiency, unspecified: Secondary | ICD-10-CM

## 2024-01-25 DIAGNOSIS — E010 Iodine-deficiency related diffuse (endemic) goiter: Secondary | ICD-10-CM | POA: Insufficient documentation

## 2024-01-25 DIAGNOSIS — E66811 Obesity, class 1: Secondary | ICD-10-CM

## 2024-01-25 DIAGNOSIS — R011 Cardiac murmur, unspecified: Secondary | ICD-10-CM | POA: Diagnosis not present

## 2024-01-25 NOTE — Assessment & Plan Note (Signed)
 Ongoing, recommend she restart Rosuvastatin  as ordered.  Check lipid panel today and will restart dependent on labs.

## 2024-01-25 NOTE — Assessment & Plan Note (Signed)
Stable, asymptomatic.  Will obtain echo if symptoms present. ?

## 2024-01-25 NOTE — Assessment & Plan Note (Signed)
 Suspect Baker's Cyst, but will obtain imaging to further assess.  Determine next steps after this returns.

## 2024-01-25 NOTE — Progress Notes (Signed)
 BP 126/76 (BP Location: Left Arm)   Pulse 73   Temp 98.7 F (37.1 C) (Oral)   Resp 15   Ht 5' 5 (1.651 m)   Wt 181 lb 9.6 oz (82.4 kg)   LMP  (LMP Unknown)   SpO2 97%   BMI 30.22 kg/m    Subjective:    Patient ID: Allison Reynolds, female    DOB: 09-Apr-1961, 63 y.o.   MRN: [id number]  HPI: Allison Reynolds is a 63 y.o. female  Chief Complaint  Patient presents with   Leg Swelling    Has been seen prior for this. UC didn't help any.    Thyroid  Problem    UC did say it felt enlarged. Patient does feel it at times.    HYPERTENSION without Chronic Kidney Disease Continues to take Amlodipine , Lisinopril , and Chlorthalidone . Continues to have left lower leg swelling intermittently, no pain.  Present for months. In past had injection to right knee for OA, but none to left. Taking Vit D at home for low levels in past. Hypertension status: stable  Satisfied with current treatment? yes Duration of hypertension: chronic BP monitoring frequency:  not checking BP range:  BP medication side effects:  no Medication compliance: good compliance Aspirin: no Recurrent headaches: no Visual changes: no Palpitations: no Dyspnea: no Chest pain: no Lower extremity edema: as above Dizzy/lightheaded: no  The 10-year ASCVD risk score (Arnett DK, et al., 2019) is: 16.6%   Values used to calculate the score:     Age: 8 years     Clincally relevant sex: Female     Is Non-Hispanic African American: Yes     Diabetic: No     Tobacco smoker: Yes     Systolic Blood Pressure: 126 mmHg     Is BP treated: Yes     HDL Cholesterol: 81 mg/dL     Total Cholesterol: 239 mg/dL  ENLARGED THYROID  Went to UC on the weekend and they told her thyroid  felt a little enlarged. She went to UC as felt that at times her swallowing was more difficult. Years ago in [place]  she was told on ultrasound she had enlargement of thyroid  and they checked every 6 months. Has noticed recently hair falling out more and dry  skin + more sweats. Fatigue: no Cold intolerance: yes Heat intolerance: yes Weight gain: no Weight loss: no Constipation: no Diarrhea/loose stools: no Palpitations: no Lower extremity edema: as above Anxiety/depressed mood: no     07/07/2023    9:51 AM 03/30/2022    1:18 PM 08/19/2021   10:02 AM 02/10/2019    8:45 AM 06/15/2017    9:01 AM  Depression screen PHQ 2/9  Decreased Interest 0 0 0 0 0  Down, Depressed, Hopeless 0 0 0 0 0  PHQ - 2 Score 0 0 0 0 0  Altered sleeping 0 0  1 1  Tired, decreased energy 0 0  0 1  Change in appetite 0 0  0 0  Feeling bad or failure about yourself  0 0  0 0  Trouble concentrating 0 0  0 0  Moving slowly or fidgety/restless 0 0  0 0  Suicidal thoughts 0 0  0 0  PHQ-9 Score 0 0  1 2  Difficult doing work/chores Not difficult at all   Not difficult at all        07/07/2023    9:52 AM 03/30/2022    1:18 PM  GAD 7 : Generalized Anxiety Score  Nervous, Anxious, on Edge 0 0  Control/stop worrying 0 0  Worry too much - different things 0 0  Trouble relaxing 0 0  Restless 0 0  Easily annoyed or irritable 0 0  Afraid - awful might happen 0 0  Total GAD 7 Score 0 0  Anxiety Difficulty Not difficult at all    Relevant past medical, surgical, family and social history reviewed and updated as indicated. Interim medical history since our last visit reviewed. Allergies and medications reviewed and updated.  Review of Systems  Constitutional:  Negative for activity change, appetite change, diaphoresis, fatigue and fever.  Respiratory:  Negative for cough, chest tightness, shortness of breath and wheezing.   Cardiovascular:  Positive for leg swelling (left only). Negative for chest pain and palpitations.  Gastrointestinal: Negative.   Endocrine: Positive for cold intolerance and heat intolerance.  Neurological: Negative.   Psychiatric/Behavioral: Negative.      Per HPI unless specifically indicated above     Objective:    BP 126/76 (BP  Location: Left Arm)   Pulse 73   Temp 98.7 F (37.1 C) (Oral)   Resp 15   Ht 5' 5 (1.651 m)   Wt 181 lb 9.6 oz (82.4 kg)   LMP  (LMP Unknown)   SpO2 97%   BMI 30.22 kg/m   Wt Readings from Last 3 Encounters:  01/25/24 181 lb 9.6 oz (82.4 kg)  01/13/24 180 lb (81.6 kg)  07/07/23 185 lb 9.6 oz (84.2 kg)    Physical Exam Vitals and nursing note reviewed.  Constitutional:      General: She is awake. She is not in acute distress.    Appearance: She is well-developed and well-groomed. She is obese. She is not ill-appearing or toxic-appearing.  HENT:     Head: Normocephalic.     Right Ear: Hearing and external ear normal.     Left Ear: Hearing and external ear normal.  Eyes:     General: Lids are normal.        Right eye: No discharge.        Left eye: No discharge.     Conjunctiva/sclera: Conjunctivae normal.     Pupils: Pupils are equal, round, and reactive to light.  Neck:     Thyroid : Thyromegaly present.     Vascular: No carotid bruit.  Cardiovascular:     Rate and Rhythm: Normal rate and regular rhythm.     Heart sounds: Normal heart sounds. No murmur heard.    No gallop.     Comments: More swelling to left lower leg noted to posterior knee. Negative Homans testing both lower legs. Pulmonary:     Effort: Pulmonary effort is normal. No accessory muscle usage or respiratory distress.     Breath sounds: Normal breath sounds. No decreased breath sounds, wheezing or rales.  Abdominal:     General: Bowel sounds are normal. There is no distension.     Palpations: Abdomen is soft.     Tenderness: There is no abdominal tenderness.  Musculoskeletal:     Cervical back: Normal range of motion and neck supple.     Right lower leg: No edema.     Left lower leg: Edema (trace) present.  Lymphadenopathy:     Cervical: No cervical adenopathy.  Skin:    General: Skin is warm and dry.  Neurological:     Mental Status: She is alert and oriented to person, place, and time.     Deep  Tendon Reflexes:  Reflexes are normal and symmetric.     Reflex Scores:      Brachioradialis reflexes are 2+ on the right side and 2+ on the left side.      Patellar reflexes are 2+ on the right side and 2+ on the left side. Psychiatric:        Attention and Perception: Attention normal.        Mood and Affect: Mood normal.        Speech: Speech normal.        Behavior: Behavior normal. Behavior is cooperative.        Thought Content: Thought content normal.     Results for orders placed or performed during the hospital encounter of 12/16/22  Group A Strep by PCR   Collection Time: 12/16/22  8:45 AM   Specimen: Throat; Sterile Swab  Result Value Ref Range   Group A Strep by PCR NOT DETECTED NOT DETECTED  Basic metabolic panel   Collection Time: 12/16/22 10:01 AM  Result Value Ref Range   Sodium 133 (L) 135 - 145 mmol/L   Potassium 3.9 3.5 - 5.1 mmol/L   Chloride 96 (L) 98 - 111 mmol/L   CO2 28 22 - 32 mmol/L   Glucose, Bld 98 70 - 99 mg/dL   BUN 6 (L) 8 - 23 mg/dL   Creatinine, Ser 9.27 0.44 - 1.00 mg/dL   Calcium  9.2 8.9 - 10.3 mg/dL   GFR, Estimated >39 >39 mL/min   Anion gap 9 5 - 15  Mononucleosis screen   Collection Time: 12/16/22 10:01 AM  Result Value Ref Range   Mono Screen NEGATIVE NEGATIVE      Assessment & Plan:   Problem List Items Addressed This Visit       Cardiovascular and Mediastinum   Hypertension   Chronic, ongoing.  BP trending down on recheck.  Recommend she monitor BP at least a few mornings a week at home and document.  DASH diet at home.  Continue current medication regimen and adjust as needed, refills sent in and to take consistently. LABS: CBC, CMP, TSH.       Relevant Orders   Comprehensive metabolic panel with GFR   CBC with Differential/Platelet     Endocrine   Thyromegaly   Noted on exam and she reports being noted on ultrasound in [place]  in past and they monitored every 6 months. Will obtain ultrasound outpatient and check thyroid   labs today.  Initiate medication as needed.  Is having some symptoms today.      Relevant Orders   T4, free   Thyroid  peroxidase antibody   TSH   US  THYROID      Other   Vitamin D  deficiency   Ongoing, noted on past labs.  Continue taking Vitamin D2 2000 units at home.  Check level today. Will order DEXA at upcoming physical.      Relevant Orders   VITAMIN D  25 Hydroxy (Vit-D Deficiency, Fractures)   Systolic murmur   Stable, asymptomatic.  Will obtain echo if symptoms present.      Obesity   BMI 30.22.  Recommended eating smaller high protein, low fat meals more frequently and exercising 30 mins a day 5 times a week with a goal of 10-15lb weight loss in the next 3 months. Patient voiced their understanding and motivation to adhere to these recommendations.       Left leg swelling   Suspect Baker's Cyst, but will obtain imaging to further assess.  Determine next  steps after this returns.      Relevant Orders   US  Venous Img Lower Unilateral Left (DVT)   Hyperlipidemia - Primary   Ongoing, recommend she restart Rosuvastatin  as ordered.  Check lipid panel today and will restart dependent on labs.      Relevant Orders   Lipid Panel w/o Chol/HDL Ratio     Follow up plan: Return for as scheduled for physical upcoming.

## 2024-01-25 NOTE — Assessment & Plan Note (Signed)
 Noted on exam and she reports being noted on ultrasound in [place]  in past and they monitored every 6 months. Will obtain ultrasound outpatient and check thyroid  labs today.  Initiate medication as needed.  Is having some symptoms today.

## 2024-01-25 NOTE — Assessment & Plan Note (Signed)
 Chronic, ongoing.  BP trending down on recheck.  Recommend she monitor BP at least a few mornings a week at home and document.  DASH diet at home.  Continue current medication regimen and adjust as needed, refills sent in and to take consistently. LABS: CBC, CMP, TSH.

## 2024-01-25 NOTE — Assessment & Plan Note (Signed)
 Ongoing, noted on past labs.  Continue taking Vitamin D2 2000 units at home.  Check level today. Will order DEXA at upcoming physical.

## 2024-01-25 NOTE — Assessment & Plan Note (Signed)
BMI 30.22.  Recommended eating smaller high protein, low fat meals more frequently and exercising 30 mins a day 5 times a week with a goal of 10-15lb weight loss in the next 3 months. Patient voiced their understanding and motivation to adhere to these recommendations.  

## 2024-01-26 ENCOUNTER — Ambulatory Visit: Payer: Self-pay | Admitting: Nurse Practitioner

## 2024-01-26 LAB — COMPREHENSIVE METABOLIC PANEL WITH GFR
ALT: 20 IU/L (ref 0–32)
AST: 22 IU/L (ref 0–40)
Albumin: 4.7 g/dL (ref 3.9–4.9)
Alkaline Phosphatase: 63 IU/L (ref 44–121)
BUN/Creatinine Ratio: 14 (ref 12–28)
BUN: 13 mg/dL (ref 8–27)
Bilirubin Total: 0.4 mg/dL (ref 0.0–1.2)
CO2: 25 mmol/L (ref 20–29)
Calcium: 9.5 mg/dL (ref 8.7–10.3)
Chloride: 91 mmol/L — ABNORMAL LOW (ref 96–106)
Creatinine, Ser: 0.9 mg/dL (ref 0.57–1.00)
Globulin, Total: 2.6 g/dL (ref 1.5–4.5)
Glucose: 86 mg/dL (ref 70–99)
Potassium: 3.8 mmol/L (ref 3.5–5.2)
Sodium: 131 mmol/L — ABNORMAL LOW (ref 134–144)
Total Protein: 7.3 g/dL (ref 6.0–8.5)
eGFR: 71 mL/min/1.73 (ref >59)

## 2024-01-26 LAB — CBC WITH DIFFERENTIAL/PLATELET
Basophils Absolute: 0.1 x10E3/uL (ref 0.0–0.2)
Basos: 1 %
EOS (ABSOLUTE): 0.1 x10E3/uL (ref 0.0–0.4)
Eos: 1 %
Hematocrit: 40 % (ref 34.0–46.6)
Hemoglobin: 12.7 g/dL (ref 11.1–15.9)
Immature Grans (Abs): 0 x10E3/uL (ref 0.0–0.1)
Immature Granulocytes: 0 %
Lymphocytes Absolute: 1.7 x10E3/uL (ref 0.7–3.1)
Lymphs: 27 %
MCH: 27.2 pg (ref 26.6–33.0)
MCHC: 31.8 g/dL (ref 31.5–35.7)
MCV: 86 fL (ref 79–97)
Monocytes Absolute: 0.8 x10E3/uL (ref 0.1–0.9)
Monocytes: 13 %
Neutrophils Absolute: 3.8 x10E3/uL (ref 1.4–7.0)
Neutrophils: 58 %
Platelets: 318 x10E3/uL (ref 150–450)
RBC: 4.67 x10E6/uL (ref 3.77–5.28)
RDW: 12.4 % (ref 11.7–15.4)
WBC: 6.5 x10E3/uL (ref 3.4–10.8)

## 2024-01-26 LAB — LIPID PANEL W/O CHOL/HDL RATIO
Cholesterol, Total: 213 mg/dL — ABNORMAL HIGH (ref 100–199)
HDL: 64 mg/dL (ref >39)
LDL Chol Calc (NIH): 120 mg/dL — ABNORMAL HIGH (ref 0–99)
Triglycerides: 165 mg/dL — ABNORMAL HIGH (ref 0–149)
VLDL Cholesterol Cal: 29 mg/dL (ref 5–40)

## 2024-01-26 LAB — T4, FREE: Free T4: 1.44 ng/dL (ref 0.82–1.77)

## 2024-01-26 LAB — VITAMIN D 25 HYDROXY (VIT D DEFICIENCY, FRACTURES): Vit D, 25-Hydroxy: 11 ng/mL — ABNORMAL LOW (ref 30.0–100.0)

## 2024-01-26 LAB — THYROID PEROXIDASE ANTIBODY: Thyroperoxidase Ab SerPl-aCnc: 9 [IU]/mL (ref 0–34)

## 2024-01-26 LAB — TSH: TSH: 0.75 u[IU]/mL (ref 0.450–4.500)

## 2024-01-26 MED ORDER — CHOLECALCIFEROL 1.25 MG (50000 UT) PO TABS: 1.0000 | ORAL_TABLET | ORAL | 3 refills | Status: DC

## 2024-01-26 NOTE — Progress Notes (Signed)
 Good afternoon, please let Allison Reynolds know her labs have returned: - Kidney and liver function are normal. - Sodium, salt level is low, trended down a little this check.  Please add some table salt to diet daily, small amount, and we will recheck this at your upcoming physical. If continues to run lower we may need to reduce your Chlorthalidone . - Vitamin D  level remains low, please start taking the weekly supplement I sent in for bone health. - Lipid panel continues to show elevations, we will discuss at physical possibly starting statin therapy for stroke prevention and heart protection. - Remainder of labs stable.  Any questions? Keep being stellar!!  Thank you for allowing me to participate in your care.  I appreciate you. Kindest regards, Elveta Rape

## 2024-02-01 ENCOUNTER — Telehealth: Payer: Self-pay

## 2024-02-01 NOTE — Telephone Encounter (Signed)
 Returned call to patient. She is aware that provider ordered US  for her versus a referral. She is aware once imaging is approved by insurance that the team who schedules will then be in touch to get her scheduled.

## 2024-02-01 NOTE — Telephone Encounter (Signed)
 Copied from CRM [id number]. Topic: Referral - Status >> Feb 01, 2024 12:55 PM Allison Reynolds wrote: Reason for CRM: Pt called in about referral to thyroid , saw referral was sent 08/26 but does not show referring office. Pt would like to know where she was referred.   PT also was requesting about referral about her leg.   Pls follow up with pt

## 2024-02-03 NOTE — Telephone Encounter (Signed)
 Patient is now aware and will contact scheduling.

## 2024-02-06 NOTE — Patient Instructions (Incomplete)
Please call to schedule your mammogram and/or bone density: [facility] at [facility]  Address: [address], [place], [place] [address] Phone: [phone]  [facility] [place] [address] [place],  [place]  [address] Phone: [phone]    DASH Eating Plan DASH stands for Dietary Approaches to Stop Hypertension. The DASH eating plan is a healthy eating plan that has been shown to: Lower high blood pressure (hypertension). Reduce your risk for type 2 diabetes, heart disease, and stroke. Help with weight loss. What are tips for following this plan? Reading food labels Check food labels for the amount of salt (sodium) per serving. Choose foods with less than 5 percent of the Daily Value (DV) of sodium. In general, foods with less than 300 milligrams (mg) of sodium per serving fit into this eating plan. To find whole grains, look for the word "whole" as the first word in the ingredient list. Shopping Buy products labeled as "low-sodium" or "no salt added." Buy fresh foods. Avoid canned foods and pre-made or frozen meals. Cooking Try not to add salt when you cook. Use salt-free seasonings or herbs instead of table salt or sea salt. Check with your health care provider or pharmacist before using salt substitutes. Do not fry foods. Cook foods in healthy ways, such as baking, boiling, grilling, roasting, or broiling. Cook using oils that are good for your heart. These include olive, canola, avocado, soybean, and sunflower oil. Meal planning  Eat a balanced diet. This should include: 4 or more servings of fruits and 4 or more servings of vegetables each day. Try to fill half of your plate with fruits and vegetables. 6-8 servings of whole grains each day. 6 or less servings of lean meat, poultry, or fish each day. 1 oz is 1 serving. A 3 oz (85 g) serving of meat is about the same size as the palm of your hand. One egg is 1 oz (28  g). 2-3 servings of low-fat dairy each day. One serving is 1 cup (237 mL). 1 serving of nuts, seeds, or beans 5 times each week. 2-3 servings of heart-healthy fats. Healthy fats called omega-3 fatty acids are found in foods such as walnuts, flaxseeds, fortified milks, and eggs. These fats are also found in cold-water fish, such as sardines, salmon, and mackerel. Limit how much you eat of: Canned or prepackaged foods. Food that is high in trans fat, such as fried foods. Food that is high in saturated fat, such as fatty meat. Desserts and other sweets, sugary drinks, and other foods with added sugar. Full-fat dairy products. Do not salt foods before eating. Do not eat more than 4 egg yolks a week. Try to eat at least 2 vegetarian meals a week. Eat more home-cooked food and less restaurant, buffet, and fast food. Lifestyle When eating at a restaurant, ask if your food can be made with less salt or no salt. If you drink alcohol: Limit how much you have to: 0-1 drink a day if you are female. 0-2 drinks a day if you are female. Know how much alcohol is in your drink. In the U.S[place] one drink is one 12 oz bottle of beer (355 mL), one 5 oz glass of wine (148 mL), or one 1 oz glass of hard liquor (44 mL). General information Avoid eating more than 2,300 mg of salt a day. If you have hypertension, you may need to reduce your sodium intake to 1,500 mg   a day. Work with your provider to stay at a healthy body weight or lose weight. Ask what the best weight range is for you. On most days of the week, get at least 30 minutes of exercise that causes your heart to beat faster. This may include walking, swimming, or biking. Work with your provider or dietitian to adjust your eating plan to meet your specific calorie needs. What foods should I eat? Fruits All fresh, dried, or frozen fruit. Canned fruits that are in their natural juice and do not have sugar added to them. Vegetables Fresh or frozen vegetables  that are raw, steamed, roasted, or grilled. Low-sodium or reduced-sodium tomato and vegetable juice. Low-sodium or reduced-sodium tomato sauce and tomato paste. Low-sodium or reduced-sodium canned vegetables. Grains Whole-grain or whole-wheat bread. Whole-grain or whole-wheat pasta. Brown rice. Oatmeal. Quinoa. Bulgur. Whole-grain and low-sodium cereals. Pita bread. Low-fat, low-sodium crackers. Whole-wheat flour tortillas. Meats and other proteins Skinless chicken or turkey. Ground chicken or turkey. Pork with fat trimmed off. Fish and seafood. Egg whites. Dried beans, peas, or lentils. Unsalted nuts, nut butters, and seeds. Unsalted canned beans. Lean cuts of beef with fat trimmed off. Low-sodium, lean precooked or cured meat, such as sausages or meat loaves. Dairy Low-fat (1%) or fat-free (skim) milk. Reduced-fat, low-fat, or fat-free cheeses. Nonfat, low-sodium ricotta or cottage cheese. Low-fat or nonfat yogurt. Low-fat, low-sodium cheese. Fats and oils Soft margarine without trans fats. Vegetable oil. Reduced-fat, low-fat, or light mayonnaise and salad dressings (reduced-sodium). Canola, safflower, olive, avocado, soybean, and sunflower oils. Avocado. Seasonings and condiments Herbs. Spices. Seasoning mixes without salt. Other foods Unsalted popcorn and pretzels. Fat-free sweets. The items listed above may not be all the foods and drinks you can have. Talk to a dietitian to learn more. What foods should I avoid? Fruits Canned fruit in a light or heavy syrup. Fried fruit. Fruit in cream or butter sauce. Vegetables Creamed or fried vegetables. Vegetables in a cheese sauce. Regular canned vegetables that are not marked as low-sodium or reduced-sodium. Regular canned tomato sauce and paste that are not marked as low-sodium or reduced-sodium. Regular tomato and vegetable juices that are not marked as low-sodium or reduced-sodium. Pickles. Olives. Grains Baked goods made with fat, such as  croissants, muffins, or some breads. Dry pasta or rice meal packs. Meats and other proteins Fatty cuts of meat. Ribs. Fried meat. Bacon. Bologna, salami, and other precooked or cured meats, such as sausages or meat loaves, that are not lean and low in sodium. Fat from the back of a pig (fatback). Bratwurst. Salted nuts and seeds. Canned beans with added salt. Canned or smoked fish. Whole eggs or egg yolks. Chicken or turkey with skin. Dairy Whole or 2% milk, cream, and half-and-half. Whole or full-fat cream cheese. Whole-fat or sweetened yogurt. Full-fat cheese. Nondairy creamers. Whipped toppings. Processed cheese and cheese spreads. Fats and oils Butter. Stick margarine. Lard. Shortening. Ghee. Bacon fat. Tropical oils, such as coconut, palm kernel, or palm oil. Seasonings and condiments Onion salt, garlic salt, seasoned salt, table salt, and sea salt. Worcestershire sauce. Tartar sauce. Barbecue sauce. Teriyaki sauce. Soy sauce, including reduced-sodium soy sauce. Steak sauce. Canned and packaged gravies. Fish sauce. Oyster sauce. Cocktail sauce. Store-bought horseradish. Ketchup. Mustard. Meat flavorings and tenderizers. Bouillon cubes. Hot sauces. Pre-made or packaged marinades. Pre-made or packaged taco seasonings. Relishes. Regular salad dressings. Other foods Salted popcorn and pretzels. The items listed above may not be all the foods and drinks you should avoid. Talk to a   dietitian to learn more. Where to find more information National Heart, Lung, and Blood Institute (NHLBI): nhlbi.nih.gov American Heart Association (AHA): heart.org Academy of Nutrition and Dietetics: eatright.org National Kidney Foundation (NKF): kidney.org This information is not intended to replace advice given to you by your health care provider. Make sure you discuss any questions you have with your health care provider. Document Revised: 06/04/2022 Document Reviewed: 06/04/2022 Elsevier Patient Education  2024  Elsevier Inc.  

## 2024-02-08 NOTE — Telephone Encounter (Signed)
 Called patient and left a message for her to call back to get scheduled for her physical. The AWV will need to be canceled since it was scheduled in error. There are no available physical appts that same day.

## 2024-02-08 NOTE — Telephone Encounter (Unsigned)
 Copied from CRM [id number]. Topic: Appointments - Scheduling Inquiry for Clinic >> Feb 08, 2024  9:55 AM Selinda RAMAN wrote: Reason for CRM: The patient called in to reschedule her physical but the system would not let me do that. Instead it automatically rescheduled to a AWV. She just wants to reschedule her physical. Please assist patient further.

## 2024-02-09 ENCOUNTER — Encounter: Payer: Self-pay | Admitting: Nurse Practitioner

## 2024-02-09 DIAGNOSIS — Z23 Encounter for immunization: Secondary | ICD-10-CM

## 2024-02-09 DIAGNOSIS — Z1211 Encounter for screening for malignant neoplasm of colon: Secondary | ICD-10-CM

## 2024-02-09 DIAGNOSIS — Z780 Asymptomatic menopausal state: Secondary | ICD-10-CM

## 2024-02-09 DIAGNOSIS — Z0000 Encounter for general adult medical examination without abnormal findings: Secondary | ICD-10-CM

## 2024-02-09 DIAGNOSIS — M7989 Other specified soft tissue disorders: Secondary | ICD-10-CM

## 2024-02-09 DIAGNOSIS — Z1231 Encounter for screening mammogram for malignant neoplasm of breast: Secondary | ICD-10-CM

## 2024-02-09 DIAGNOSIS — E010 Iodine-deficiency related diffuse (endemic) goiter: Secondary | ICD-10-CM

## 2024-02-09 DIAGNOSIS — E782 Mixed hyperlipidemia: Secondary | ICD-10-CM

## 2024-02-09 DIAGNOSIS — E66811 Obesity, class 1: Secondary | ICD-10-CM

## 2024-02-15 ENCOUNTER — Ambulatory Visit
Admission: RE | Admit: 2024-02-15 | Discharge: 2024-02-15 | Disposition: A | Discharge status: Home / Self Care | Source: Ambulatory Visit | Attending: Nurse Practitioner | Admitting: Nurse Practitioner

## 2024-02-15 DIAGNOSIS — E010 Iodine-deficiency related diffuse (endemic) goiter: Secondary | ICD-10-CM | POA: Insufficient documentation

## 2024-02-15 DIAGNOSIS — M7989 Other specified soft tissue disorders: Secondary | ICD-10-CM | POA: Insufficient documentation

## 2024-02-15 NOTE — Progress Notes (Signed)
 Please let Allison Reynolds know her imaging returned and no clots are noted.  Great news!!

## 2024-02-16 NOTE — Progress Notes (Signed)
 Good day, please let Faizah know her thyroid  imaging returned and overall is reassuring.  There are some benign nodules present and they do not recommend biopsy on these.  Overall stable findings.  Any questions? Keep being amazing!!  Thank you for allowing me to participate in your care.  I appreciate you. Kindest regards, Kayvion Arneson

## 2024-03-04 NOTE — Patient Instructions (Incomplete)

## 2024-03-07 ENCOUNTER — Encounter: Payer: Self-pay | Admitting: Nurse Practitioner

## 2024-04-01 NOTE — Patient Instructions (Incomplete)
 Please call to schedule your mammogram and/or bone density: [facility] at [facility]  Address: [address], [place], [place] [address] Phone: [phone]  [facility] [address] [place],  [place]  [address] Phone: [phone]    Healthy Eating, Adult Healthy eating may help you get and keep a healthy body weight, reduce the risk of chronic disease, and live a long and productive life. It is important to follow a healthy eating pattern. Your nutritional and calorie needs should be met mainly by different nutrient-rich foods. What are tips for following this plan? Reading food labels Read labels and choose the following: Reduced or low sodium products. Juices with 100% fruit juice. Foods with low saturated fats (<3 g per serving) and high polyunsaturated and monounsaturated fats. Foods with whole grains, such as whole wheat, cracked wheat, brown rice, and wild rice. Whole grains that are fortified with folic acid. This is recommended for females who are pregnant or who want to become pregnant. Read labels and do not eat or drink the following: Foods or drinks with added sugars. These include foods that contain brown sugar, corn sweetener, corn syrup, dextrose, fructose, glucose, high-fructose corn syrup, honey, invert sugar, lactose, malt syrup, maltose, molasses, raw sugar, sucrose, trehalose, or turbinado sugar. Limit your intake of added sugars to less than 10% of your total daily calories. Do not eat more than the following amounts of added sugar per day: 6 teaspoons (25 g) for females. 9 teaspoons (38 g) for males. Foods that contain processed or refined starches and grains. Refined grain products, such as white flour, degermed cornmeal, white bread, and white rice. Shopping Choose nutrient-rich snacks, such as vegetables, whole fruits, and nuts. Avoid high-calorie and high-sugar snacks, such as potato chips,  fruit snacks, and candy. Use oil-based dressings and spreads on foods instead of solid fats such as butter, margarine, sour cream, or cream cheese. Limit pre-made sauces, mixes, and instant products such as flavored rice, instant noodles, and ready-made pasta. Try more plant-protein sources, such as tofu, tempeh, black beans, edamame, lentils, nuts, and seeds. Explore eating plans such as the Mediterranean diet or vegetarian diet. Try heart-healthy dips made with beans and healthy fats like hummus and guacamole. Vegetables go great with these. Cooking Use oil to saut or stir-fry foods instead of solid fats such as butter, margarine, or lard. Try baking, boiling, grilling, or broiling instead of frying. Remove the fatty part of meats before cooking. Steam vegetables in water or broth. Meal planning  At meals, imagine dividing your plate into fourths: One-half of your plate is fruits and vegetables. One-fourth of your plate is whole grains. One-fourth of your plate is protein, especially lean meats, poultry, eggs, tofu, beans, or nuts. Include low-fat dairy as part of your daily diet. Lifestyle Choose healthy options in all settings, including home, work, school, restaurants, or stores. Prepare your food safely: Wash your hands after handling raw meats. Where you prepare food, keep surfaces clean by regularly washing with hot, soapy water. Keep raw meats separate from ready-to-eat foods, such as fruits and vegetables. Cook seafood, meat, poultry, and eggs to the recommended temperature. Get a food thermometer. Store foods at safe temperatures. In general: Keep cold foods at 43F (4.4C) or below. Keep hot foods at 143F (60C) or above. Keep your freezer at [facility] (-17.8C) or below. Foods are not safe to eat if they have been between the temperatures of 40-143F (4.4-60C) for more  than 2 hours. What foods should I eat? Fruits Aim to eat 1-2 cups of fresh, canned (in natural juice),  or frozen fruits each day. One cup of fruit equals 1 small apple, 1 large banana, 8 large strawberries, 1 cup (237 g) canned fruit,  cup (82 g) dried fruit, or 1 cup (240 mL) 100% juice. Vegetables Aim to eat 2-4 cups of fresh and frozen vegetables each day, including different varieties and colors. One cup of vegetables equals 1 cup (91 g) broccoli or cauliflower florets, 2 medium carrots, 2 cups (150 g) raw, leafy greens, 1 large tomato, 1 large bell pepper, 1 large sweet potato, or 1 medium white potato. Grains Aim to eat 5-10 ounce-equivalents of whole grains each day. Examples of 1 ounce-equivalent of grains include 1 slice of bread, 1 cup (40 g) ready-to-eat cereal, 3 cups (24 g) popcorn, or  cup (93 g) cooked rice. Meats and other proteins Try to eat 5-7 ounce-equivalents of protein each day. Examples of 1 ounce-equivalent of protein include 1 egg,  oz nuts (12 almonds, 24 pistachios, or 7 walnut halves), 1/4 cup (90 g) cooked beans, 6 tablespoons (90 g) hummus or 1 tablespoon (16 g) peanut butter. A cut of meat or fish that is the size of a deck of cards is about 3-4 ounce-equivalents (85 g). Of the protein you eat each week, try to have at least 8 sounce (227 g) of seafood. This is about 2 servings per week. This includes salmon, trout, herring, sardines, and anchovies. Dairy Aim to eat 3 cup-equivalents of fat-free or low-fat dairy each day. Examples of 1 cup-equivalent of dairy include 1 cup (240 mL) milk, 8 ounces (250 g) yogurt, 1 ounces (44 g) natural cheese, or 1 cup (240 mL) fortified soy milk. Fats and oils Aim for about 5 teaspoons (21 g) of fats and oils per day. Choose monounsaturated fats, such as canola and olive oils, mayonnaise made with olive oil or avocado oil, avocados, peanut butter, and most nuts, or polyunsaturated fats, such as sunflower, corn, and soybean oils, walnuts, pine nuts, sesame seeds, sunflower seeds, and flaxseed. Beverages Aim for 6 eight-ounce glasses of  water per day. Limit coffee to 3-5 eight-ounce cups per day. Limit caffeinated beverages that have added calories, such as soda and energy drinks. If you drink alcohol: Limit how much you have to: 0-1 drink a day if you are female. 0-2 drinks a day if you are female. Know how much alcohol is in your drink. In the [place] one drink is one 12 oz bottle of beer (355 mL), one 5 oz glass of wine (148 mL), or one 1 oz glass of hard liquor (44 mL). Seasoning and other foods Try not to add too much salt to your food. Try using herbs and spices instead of salt. Try not to add sugar to food. This information is based on U.S. nutrition guidelines. To learn more, visit DisposableNylon.be. Exact amounts may vary. You may need different amounts. This information is not intended to replace advice given to you by your health care provider. Make sure you discuss any questions you have with your health care provider. Document Revised: 02/16/2022 Document Reviewed: 02/16/2022 Elsevier Patient Education  2024 [employer].

## 2024-04-03 ENCOUNTER — Encounter: Payer: Self-pay | Admitting: Nurse Practitioner

## 2024-04-03 DIAGNOSIS — E6609 Other obesity due to excess calories: Secondary | ICD-10-CM

## 2024-04-03 DIAGNOSIS — E782 Mixed hyperlipidemia: Secondary | ICD-10-CM

## 2024-04-03 DIAGNOSIS — Z780 Asymptomatic menopausal state: Secondary | ICD-10-CM

## 2024-04-03 DIAGNOSIS — I10 Essential (primary) hypertension: Secondary | ICD-10-CM

## 2024-04-03 DIAGNOSIS — Z23 Encounter for immunization: Secondary | ICD-10-CM

## 2024-04-03 DIAGNOSIS — Z1231 Encounter for screening mammogram for malignant neoplasm of breast: Secondary | ICD-10-CM

## 2024-04-03 DIAGNOSIS — E559 Vitamin D deficiency, unspecified: Secondary | ICD-10-CM

## 2024-04-03 DIAGNOSIS — R011 Cardiac murmur, unspecified: Secondary | ICD-10-CM

## 2024-04-03 DIAGNOSIS — Z0000 Encounter for general adult medical examination without abnormal findings: Secondary | ICD-10-CM

## 2024-04-11 ENCOUNTER — Ambulatory Visit

## 2024-04-21 ENCOUNTER — Ambulatory Visit: Payer: Self-pay | Admitting: Family Medicine

## 2024-04-21 ENCOUNTER — Ambulatory Visit
Admission: RE | Admit: 2024-04-21 | Discharge: 2024-04-21 | Disposition: A | Discharge status: Home / Self Care | Source: Ambulatory Visit | Attending: Family Medicine | Admitting: Family Medicine

## 2024-04-21 ENCOUNTER — Encounter: Payer: Self-pay | Admitting: Family Medicine

## 2024-04-21 ENCOUNTER — Ambulatory Visit (INDEPENDENT_AMBULATORY_CARE_PROVIDER_SITE_OTHER): Admitting: Family Medicine

## 2024-04-21 VITALS — BP 132/64 | HR 64 | Temp 97.4°F | Ht 65.0 in | Wt 182.2 lb

## 2024-04-21 DIAGNOSIS — Z1231 Encounter for screening mammogram for malignant neoplasm of breast: Secondary | ICD-10-CM | POA: Diagnosis not present

## 2024-04-21 DIAGNOSIS — R1011 Right upper quadrant pain: Secondary | ICD-10-CM

## 2024-04-21 DIAGNOSIS — N644 Mastodynia: Secondary | ICD-10-CM

## 2024-04-21 NOTE — Progress Notes (Signed)
 BP 132/64   Pulse 64   Temp (!) 97.4 F (36.3 C) (Oral)   Ht 5' 5 (1.651 m)   Wt 182 lb 3.2 oz (82.6 kg)   LMP  (LMP Unknown)   SpO2 100%   BMI 30.32 kg/m    Subjective:    Patient ID: Calton Pinal, female    DOB: March 23, 1961, 63 y.o.   MRN: [id number]  HPI: Karron Goens is a 63 y.o. female  Chief Complaint  Patient presents with   RUQ pain    Right breast. Started about a week. On and off. Dull achy, Denies 4/10. Denies lumps.    ABDOMINAL PAIN  Duration:4-5 days Onset: sudden Severity: mild to moderate Quality: aching and sharp Location:  RUQ- right under breast, borderline chest/abdomen  Episode duration: constant Radiation: yes- into her back Frequency: waxing and waning Status: stable Treatments attempted: tylenol  Fever: no Nausea: no Vomiting: no Weight loss: no Decreased appetite: no Diarrhea: no Constipation: no Blood in stool: no Heartburn: no Jaundice: no Rash: no Dysuria/urinary frequency: no Hematuria: no History of sexually transmitted disease: no Recurrent NSAID use: no  Relevant past medical, surgical, family and social history reviewed and updated as indicated. Interim medical history since our last visit reviewed. Allergies and medications reviewed and updated.  Review of Systems  Constitutional: Negative.   Respiratory: Negative.    Cardiovascular: Negative.   Gastrointestinal:  Positive for abdominal pain. Negative for abdominal distention, anal bleeding, blood in stool, constipation, diarrhea, nausea, rectal pain and vomiting.  Endocrine: Negative.   Musculoskeletal: Negative.   Skin: Negative.   Psychiatric/Behavioral: Negative.      Per HPI unless specifically indicated above     Objective:    BP 132/64   Pulse 64   Temp (!) 97.4 F (36.3 C) (Oral)   Ht 5' 5 (1.651 m)   Wt 182 lb 3.2 oz (82.6 kg)   LMP  (LMP Unknown)   SpO2 100%   BMI 30.32 kg/m   Wt Readings from Last 3 Encounters:  04/21/24 182 lb 3.2  oz (82.6 kg)  01/25/24 181 lb 9.6 oz (82.4 kg)  01/13/24 180 lb (81.6 kg)    Physical Exam Vitals and nursing note reviewed.  Constitutional:      General: She is not in acute distress.    Appearance: Normal appearance. She is not ill-appearing, toxic-appearing or diaphoretic.  HENT:     Head: Normocephalic and atraumatic.     Right Ear: External ear normal.     Left Ear: External ear normal.     Nose: Nose normal.     Mouth/Throat:     Mouth: Mucous membranes are moist.     Pharynx: Oropharynx is clear.  Eyes:     General: No scleral icterus.       Right eye: No discharge.        Left eye: No discharge.     Extraocular Movements: Extraocular movements intact.     Conjunctiva/sclera: Conjunctivae normal.     Pupils: Pupils are equal, round, and reactive to light.  Cardiovascular:     Rate and Rhythm: Normal rate and regular rhythm.     Pulses: Normal pulses.     Heart sounds: Murmur heard.     No friction rub. No gallop.  Pulmonary:     Effort: Pulmonary effort is normal. No respiratory distress.     Breath sounds: Normal breath sounds. No stridor. No wheezing, rhonchi or rales.  Chest:  Chest wall: No tenderness.  Abdominal:     General: Abdomen is flat. Bowel sounds are normal. There is no distension.     Palpations: There is no mass.     Tenderness: There is no abdominal tenderness. There is no right CVA tenderness, left CVA tenderness, guarding or rebound.     Hernia: No hernia is present.  Musculoskeletal:        General: Normal range of motion.     Cervical back: Normal range of motion and neck supple.  Skin:    General: Skin is warm and dry.     Capillary Refill: Capillary refill takes less than 2 seconds.     Coloration: Skin is not jaundiced or pale.     Findings: No bruising, erythema, lesion or rash.  Neurological:     General: No focal deficit present.     Mental Status: She is alert and oriented to person, place, and time. Mental status is at baseline.   Psychiatric:        Mood and Affect: Mood normal.        Behavior: Behavior normal.        Thought Content: Thought content normal.        Judgment: Judgment normal.     Results for orders placed or performed in visit on 01/25/24  T4, free   Collection Time: 01/25/24 11:49 AM  Result Value Ref Range   Free T4 1.44 0.82 - 1.77 ng/dL  Thyroid  peroxidase antibody   Collection Time: 01/25/24 11:49 AM  Result Value Ref Range   Thyroperoxidase Ab SerPl-aCnc 9 0 - 34 IU/mL  TSH   Collection Time: 01/25/24 11:49 AM  Result Value Ref Range   TSH 0.750 0.450 - 4.500 uIU/mL  Comprehensive metabolic panel with GFR   Collection Time: 01/25/24 11:49 AM  Result Value Ref Range   Glucose 86 70 - 99 mg/dL   BUN 13 8 - 27 mg/dL   Creatinine, Ser 9.09 0.57 - 1.00 mg/dL   eGFR 71 >40 fO/fpw/8.26   BUN/Creatinine Ratio 14 12 - 28   Sodium 131 (L) 134 - 144 mmol/L   Potassium 3.8 3.5 - 5.2 mmol/L   Chloride 91 (L) 96 - 106 mmol/L   CO2 25 20 - 29 mmol/L   Calcium  9.5 8.7 - 10.3 mg/dL   Total Protein 7.3 6.0 - 8.5 g/dL   Albumin 4.7 3.9 - 4.9 g/dL   Globulin, Total 2.6 1.5 - 4.5 g/dL   Bilirubin Total 0.4 0.0 - 1.2 mg/dL   Alkaline Phosphatase 63 44 - 121 IU/L   AST 22 0 - 40 IU/L   ALT 20 0 - 32 IU/L  CBC with Differential/Platelet   Collection Time: 01/25/24 11:49 AM  Result Value Ref Range   WBC 6.5 3.4 - 10.8 x10E3/uL   RBC 4.67 3.77 - 5.28 x10E6/uL   Hemoglobin 12.7 11.1 - 15.9 g/dL   Hematocrit 59.9 65.9 - 46.6 %   MCV 86 79 - 97 fL   MCH 27.2 26.6 - 33.0 pg   MCHC 31.8 31.5 - 35.7 g/dL   RDW 87.5 88.2 - 84.5 %   Platelets 318 150 - 450 x10E3/uL   Neutrophils 58 Not Estab. %   Lymphs 27 Not Estab. %   Monocytes 13 Not Estab. %   Eos 1 Not Estab. %   Basos 1 Not Estab. %   Neutrophils Absolute 3.8 1.4 - 7.0 x10E3/uL   Lymphocytes Absolute 1.7 0.7 - 3.1 x10E3/uL  Monocytes Absolute 0.8 0.1 - 0.9 x10E3/uL   EOS (ABSOLUTE) 0.1 0.0 - 0.4 x10E3/uL   Basophils Absolute 0.1  0.0 - 0.2 x10E3/uL   Immature Granulocytes 0 Not Estab. %   Immature Grans (Abs) 0.0 0.0 - 0.1 x10E3/uL  Lipid Panel w/o Chol/HDL Ratio   Collection Time: 01/25/24 11:49 AM  Result Value Ref Range   Cholesterol, Total 213 (H) 100 - 199 mg/dL   Triglycerides 834 (H) 0 - 149 mg/dL   HDL 64 >60 mg/dL   VLDL Cholesterol Cal 29 5 - 40 mg/dL   LDL Chol Calc (NIH) 879 (H) 0 - 99 mg/dL  VITAMIN D  25 Hydroxy (Vit-D Deficiency, Fractures)   Collection Time: 01/25/24 11:49 AM  Result Value Ref Range   Vit D, 25-Hydroxy 11.0 (L) 30.0 - 100.0 ng/mL      Assessment & Plan:   Problem List Items Addressed This Visit   None Visit Diagnoses       RUQ pain    -  Primary   Borderline chest vs abdomen- will check RUQ US  to r/o gall stones. Await results.   Relevant Orders   US  Abdomen Limited RUQ (LIVER/GB)     Breast pain, right       No mammo since 2023- will get her scheduled for mammo/diagnostic ASAP. Await results.   Relevant Orders   MM 3D SCREENING MAMMOGRAM BILATERAL BREAST   MM 3D DIAGNOSTIC MAMMOGRAM BILATERAL BREAST   US  LIMITED ULTRASOUND INCLUDING AXILLA RIGHT BREAST   MM 3D DIAGNOSTIC MAMMOGRAM UNILATERAL RIGHT BREAST     Encounter for screening mammogram for malignant neoplasm of breast       Relevant Orders   MM 3D SCREENING MAMMOGRAM BILATERAL BREAST        Follow up plan: Return for As scheduled with PCP- please double check she's booked for [employer].

## 2024-04-21 NOTE — Telephone Encounter (Signed)
 Copied from CRM [id number]. Topic: Clinical - Lab/Test Results >> Apr 21, 2024  1:21 PM Shanda MATSU wrote: Reason for CRM: Patient calling in req to speak directly with her PCP in regards to results of ultrasound.

## 2024-04-25 ENCOUNTER — Other Ambulatory Visit: Payer: Self-pay | Admitting: Family Medicine

## 2024-04-25 DIAGNOSIS — N644 Mastodynia: Secondary | ICD-10-CM

## 2024-05-02 ENCOUNTER — Other Ambulatory Visit: Payer: Self-pay | Admitting: Family Medicine

## 2024-05-02 DIAGNOSIS — Z1231 Encounter for screening mammogram for malignant neoplasm of breast: Secondary | ICD-10-CM

## 2024-05-10 ENCOUNTER — Inpatient Hospital Stay: Admission: RE | Admit: 2024-05-10 | Discharge: 2024-05-10 | Attending: Family Medicine

## 2024-05-10 DIAGNOSIS — Z1231 Encounter for screening mammogram for malignant neoplasm of breast: Secondary | ICD-10-CM | POA: Insufficient documentation

## 2024-05-13 NOTE — Patient Instructions (Incomplete)
 Please call to schedule your mammogram and/or bone density: [facility] at [facility]  Address: [address], [place], [place] [address] Phone: [phone]  [facility] [address] [place],  [place]  [address] Phone: [phone]    Healthy Eating, Adult Healthy eating may help you get and keep a healthy body weight, reduce the risk of chronic disease, and live a long and productive life. It is important to follow a healthy eating pattern. Your nutritional and calorie needs should be met mainly by different nutrient-rich foods. What are tips for following this plan? Reading food labels Read labels and choose the following: Reduced or low sodium products. Juices with 100% fruit juice. Foods with low saturated fats (<3 g per serving) and high polyunsaturated and monounsaturated fats. Foods with whole grains, such as whole wheat, cracked wheat, brown rice, and wild rice. Whole grains that are fortified with folic acid. This is recommended for females who are pregnant or who want to become pregnant. Read labels and do not eat or drink the following: Foods or drinks with added sugars. These include foods that contain brown sugar, corn sweetener, corn syrup, dextrose, fructose, glucose, high-fructose corn syrup, honey, invert sugar, lactose, malt syrup, maltose, molasses, raw sugar, sucrose, trehalose, or turbinado sugar. Limit your intake of added sugars to less than 10% of your total daily calories. Do not eat more than the following amounts of added sugar per day: 6 teaspoons (25 g) for females. 9 teaspoons (38 g) for males. Foods that contain processed or refined starches and grains. Refined grain products, such as white flour, degermed cornmeal, white bread, and white rice. Shopping Choose nutrient-rich snacks, such as vegetables, whole fruits, and nuts. Avoid high-calorie and high-sugar snacks, such as potato chips,  fruit snacks, and candy. Use oil-based dressings and spreads on foods instead of solid fats such as butter, margarine, sour cream, or cream cheese. Limit pre-made sauces, mixes, and instant products such as flavored rice, instant noodles, and ready-made pasta. Try more plant-protein sources, such as tofu, tempeh, black beans, edamame, lentils, nuts, and seeds. Explore eating plans such as the Mediterranean diet or vegetarian diet. Try heart-healthy dips made with beans and healthy fats like hummus and guacamole. Vegetables go great with these. Cooking Use oil to saut or stir-fry foods instead of solid fats such as butter, margarine, or lard. Try baking, boiling, grilling, or broiling instead of frying. Remove the fatty part of meats before cooking. Steam vegetables in water or broth. Meal planning  At meals, imagine dividing your plate into fourths: One-half of your plate is fruits and vegetables. One-fourth of your plate is whole grains. One-fourth of your plate is protein, especially lean meats, poultry, eggs, tofu, beans, or nuts. Include low-fat dairy as part of your daily diet. Lifestyle Choose healthy options in all settings, including home, work, school, restaurants, or stores. Prepare your food safely: Wash your hands after handling raw meats. Where you prepare food, keep surfaces clean by regularly washing with hot, soapy water. Keep raw meats separate from ready-to-eat foods, such as fruits and vegetables. Cook seafood, meat, poultry, and eggs to the recommended temperature. Get a food thermometer. Store foods at safe temperatures. In general: Keep cold foods at 43F (4.4C) or below. Keep hot foods at 143F (60C) or above. Keep your freezer at [facility] (-17.8C) or below. Foods are not safe to eat if they have been between the temperatures of 40-143F (4.4-60C) for more  than 2 hours. What foods should I eat? Fruits Aim to eat 1-2 cups of fresh, canned (in natural juice),  or frozen fruits each day. One cup of fruit equals 1 small apple, 1 large banana, 8 large strawberries, 1 cup (237 g) canned fruit,  cup (82 g) dried fruit, or 1 cup (240 mL) 100% juice. Vegetables Aim to eat 2-4 cups of fresh and frozen vegetables each day, including different varieties and colors. One cup of vegetables equals 1 cup (91 g) broccoli or cauliflower florets, 2 medium carrots, 2 cups (150 g) raw, leafy greens, 1 large tomato, 1 large bell pepper, 1 large sweet potato, or 1 medium white potato. Grains Aim to eat 5-10 ounce-equivalents of whole grains each day. Examples of 1 ounce-equivalent of grains include 1 slice of bread, 1 cup (40 g) ready-to-eat cereal, 3 cups (24 g) popcorn, or  cup (93 g) cooked rice. Meats and other proteins Try to eat 5-7 ounce-equivalents of protein each day. Examples of 1 ounce-equivalent of protein include 1 egg,  oz nuts (12 almonds, 24 pistachios, or 7 walnut halves), 1/4 cup (90 g) cooked beans, 6 tablespoons (90 g) hummus or 1 tablespoon (16 g) peanut butter. A cut of meat or fish that is the size of a deck of cards is about 3-4 ounce-equivalents (85 g). Of the protein you eat each week, try to have at least 8 sounce (227 g) of seafood. This is about 2 servings per week. This includes salmon, trout, herring, sardines, and anchovies. Dairy Aim to eat 3 cup-equivalents of fat-free or low-fat dairy each day. Examples of 1 cup-equivalent of dairy include 1 cup (240 mL) milk, 8 ounces (250 g) yogurt, 1 ounces (44 g) natural cheese, or 1 cup (240 mL) fortified soy milk. Fats and oils Aim for about 5 teaspoons (21 g) of fats and oils per day. Choose monounsaturated fats, such as canola and olive oils, mayonnaise made with olive oil or avocado oil, avocados, peanut butter, and most nuts, or polyunsaturated fats, such as sunflower, corn, and soybean oils, walnuts, pine nuts, sesame seeds, sunflower seeds, and flaxseed. Beverages Aim for 6 eight-ounce glasses of  water per day. Limit coffee to 3-5 eight-ounce cups per day. Limit caffeinated beverages that have added calories, such as soda and energy drinks. If you drink alcohol: Limit how much you have to: 0-1 drink a day if you are female. 0-2 drinks a day if you are female. Know how much alcohol is in your drink. In the [place] one drink is one 12 oz bottle of beer (355 mL), one 5 oz glass of wine (148 mL), or one 1 oz glass of hard liquor (44 mL). Seasoning and other foods Try not to add too much salt to your food. Try using herbs and spices instead of salt. Try not to add sugar to food. This information is based on U.S. nutrition guidelines. To learn more, visit DisposableNylon.be. Exact amounts may vary. You may need different amounts. This information is not intended to replace advice given to you by your health care provider. Make sure you discuss any questions you have with your health care provider. Document Revised: 02/16/2022 Document Reviewed: 02/16/2022 Elsevier Patient Education  2024 [employer].

## 2024-05-15 ENCOUNTER — Encounter: Admitting: Nurse Practitioner

## 2024-05-16 ENCOUNTER — Ambulatory Visit: Payer: Self-pay | Admitting: Family Medicine

## 2024-06-16 ENCOUNTER — Ambulatory Visit: Admitting: Nurse Practitioner

## 2024-06-16 ENCOUNTER — Encounter: Payer: Self-pay | Admitting: Nurse Practitioner

## 2024-06-16 VITALS — BP 134/84 | HR 75 | Temp 97.4°F | Resp 17 | Ht 65.0 in | Wt 179.4 lb

## 2024-06-16 DIAGNOSIS — M545 Low back pain, unspecified: Secondary | ICD-10-CM | POA: Insufficient documentation

## 2024-06-16 DIAGNOSIS — L729 Follicular cyst of the skin and subcutaneous tissue, unspecified: Secondary | ICD-10-CM | POA: Insufficient documentation

## 2024-06-16 LAB — URINALYSIS, ROUTINE W REFLEX MICROSCOPIC
Bilirubin, UA: NEGATIVE
Glucose, UA: NEGATIVE
Ketones, UA: NEGATIVE
Leukocytes,UA: NEGATIVE
Nitrite, UA: NEGATIVE
Protein,UA: NEGATIVE
RBC, UA: NEGATIVE
Specific Gravity, UA: <1.005 — ABNORMAL LOW (ref 1.005–1.030)
Urobilinogen, Ur: 0.2 mg/dL (ref 0.2–1.0)
pH, UA: 6.5 (ref 5.0–7.5)

## 2024-06-16 LAB — WET PREP FOR TRICH, YEAST, CLUE
Clue Cell Exam: NEGATIVE
Trichomonas Exam: NEGATIVE
Yeast Exam: NEGATIVE

## 2024-06-16 MED ORDER — TIZANIDINE HCL 4 MG PO TABS: 4.0000 mg | ORAL_TABLET | Freq: Four times a day (QID) | ORAL | 1 refills | Status: AC | PRN

## 2024-06-16 NOTE — Assessment & Plan Note (Signed)
 Acute for 3 days to right lower back with some radiation around to front. UA and wet prep all negative. Suspect this is more muscular in nature. Overall exam reassuring and no red flags. Will send in refills on Tizanidine  (she is aware not to take while working or driving) and recommend she continue to use heat as needed + add on Icy/Hot lidocaine  patches and Voltaren  gel. Gentle stretching at home. Return if worsening or ongoing symptoms. Strict ER precautions provided.

## 2024-06-16 NOTE — Progress Notes (Signed)
 "  BP 134/84 (BP Location: Left Arm, Patient Position: Sitting, Cuff Size: Normal)   Pulse 75   Temp (!) 97.4 F (36.3 C) (Oral)   Resp 17   Ht 5' 5 (1.651 m)   Wt 179 lb 6.4 oz (81.4 kg)   LMP  (LMP Unknown)   SpO2 97%   BMI 29.85 kg/m    Subjective:    Patient ID: Allison Reynolds, female    DOB: 16-May-1961, 64 y.o.   MRN: [id number]  HPI: Allison Reynolds is a 64 y.o. female  Chief Complaint  Patient presents with   Abdominal Pain    Right side pain and discomfort, also has a knot in head she wants provider to feel   Has lump on head she would like looked at, showed up 3-4 months ago.  Would like looked at.  ABDOMINAL/BACK PAIN  Having pain to right lower abdomen that started 3 days ago and around into back too. Duration:days Onset: sudden Severity: 4/10 Quality: sharp and aching Location:  RLQ  Episode duration:  Radiation: no Frequency: intermittent Alleviating factors: heating pad Aggravating factors: movement on occasion Status: stable Treatments attempted: Tylenol  and heating pad Fever: no Nausea: no Vomiting: no Weight loss: no Decreased appetite: no Diarrhea: no Constipation: no Blood in stool: no Heartburn: no Jaundice: no Rash: no Dysuria/urinary frequency: no Hematuria: no History of sexually transmitted disease: no Recurrent NSAID use: no   Relevant past medical, surgical, family and social history reviewed and updated as indicated. Interim medical history since our last visit reviewed. Allergies and medications reviewed and updated.  Review of Systems  Constitutional:  Negative for activity change, appetite change, diaphoresis, fatigue and fever.  Respiratory:  Negative for cough, chest tightness, shortness of breath and wheezing.   Cardiovascular:  Negative for chest pain, palpitations and leg swelling.  Gastrointestinal:  Positive for abdominal pain. Negative for abdominal distention, blood in stool, constipation, diarrhea, nausea and  vomiting.  Endocrine: Negative for cold intolerance and heat intolerance.  Musculoskeletal:  Positive for back pain.  Neurological: Negative.   Psychiatric/Behavioral: Negative.      Per HPI unless specifically indicated above     Objective:    BP 134/84 (BP Location: Left Arm, Patient Position: Sitting, Cuff Size: Normal)   Pulse 75   Temp (!) 97.4 F (36.3 C) (Oral)   Resp 17   Ht 5' 5 (1.651 m)   Wt 179 lb 6.4 oz (81.4 kg)   LMP  (LMP Unknown)   SpO2 97%   BMI 29.85 kg/m   Wt Readings from Last 3 Encounters:  06/16/24 179 lb 6.4 oz (81.4 kg)  04/21/24 182 lb 3.2 oz (82.6 kg)  01/25/24 181 lb 9.6 oz (82.4 kg)    Physical Exam Vitals and nursing note reviewed.  Constitutional:      General: She is awake. She is not in acute distress.    Appearance: She is well-developed, well-groomed and overweight. She is not ill-appearing or toxic-appearing.  HENT:     Head: Normocephalic.      Right Ear: Hearing and external ear normal.     Left Ear: Hearing and external ear normal.  Eyes:     General: Lids are normal.        Right eye: No discharge.        Left eye: No discharge.     Conjunctiva/sclera: Conjunctivae normal.     Pupils: Pupils are equal, round, and reactive to light.  Neck:  Thyroid : No thyromegaly.     Vascular: No carotid bruit.  Cardiovascular:     Rate and Rhythm: Normal rate and regular rhythm.     Heart sounds: Normal heart sounds. No murmur heard.    No gallop.  Pulmonary:     Effort: Pulmonary effort is normal. No accessory muscle usage or respiratory distress.     Breath sounds: Normal breath sounds. No decreased breath sounds, wheezing or rales.  Abdominal:     General: Bowel sounds are normal. There is no distension.     Palpations: Abdomen is soft.     Tenderness: There is no abdominal tenderness. There is no right CVA tenderness, left CVA tenderness or guarding. Negative signs include psoas sign and obturator sign.     Comments: No rashes  noted.  Musculoskeletal:     Cervical back: Normal range of motion and neck supple.     Lumbar back: No swelling, spasms or tenderness. Normal range of motion. Negative right straight leg raise test and negative left straight leg raise test.     Right lower leg: No edema.     Left lower leg: No edema.  Lymphadenopathy:     Cervical: No cervical adenopathy.  Skin:    General: Skin is warm and dry.  Neurological:     Mental Status: She is alert and oriented to person, place, and time.     Deep Tendon Reflexes: Reflexes are normal and symmetric.     Reflex Scores:      Brachioradialis reflexes are 2+ on the right side and 2+ on the left side.      Patellar reflexes are 2+ on the right side and 2+ on the left side. Psychiatric:        Attention and Perception: Attention normal.        Mood and Affect: Mood normal.        Speech: Speech normal.        Behavior: Behavior normal. Behavior is cooperative.        Thought Content: Thought content normal.    Results for orders placed or performed in visit on 01/25/24  T4, free   Collection Time: 01/25/24 11:49 AM  Result Value Ref Range   Free T4 1.44 0.82 - 1.77 ng/dL  Thyroid  peroxidase antibody   Collection Time: 01/25/24 11:49 AM  Result Value Ref Range   Thyroperoxidase Ab SerPl-aCnc 9 0 - 34 IU/mL  TSH   Collection Time: 01/25/24 11:49 AM  Result Value Ref Range   TSH 0.750 0.450 - 4.500 uIU/mL  Comprehensive metabolic panel with GFR   Collection Time: 01/25/24 11:49 AM  Result Value Ref Range   Glucose 86 70 - 99 mg/dL   BUN 13 8 - 27 mg/dL   Creatinine, Ser 9.09 0.57 - 1.00 mg/dL   eGFR 71 >40 fO/fpw/8.26   BUN/Creatinine Ratio 14 12 - 28   Sodium 131 (L) 134 - 144 mmol/L   Potassium 3.8 3.5 - 5.2 mmol/L   Chloride 91 (L) 96 - 106 mmol/L   CO2 25 20 - 29 mmol/L   Calcium  9.5 8.7 - 10.3 mg/dL   Total Protein 7.3 6.0 - 8.5 g/dL   Albumin 4.7 3.9 - 4.9 g/dL   Globulin, Total 2.6 1.5 - 4.5 g/dL   Bilirubin Total 0.4 0.0  - 1.2 mg/dL   Alkaline Phosphatase 63 44 - 121 IU/L   AST 22 0 - 40 IU/L   ALT 20 0 - 32 IU/L  CBC  with Differential/Platelet   Collection Time: 01/25/24 11:49 AM  Result Value Ref Range   WBC 6.5 3.4 - 10.8 x10E3/uL   RBC 4.67 3.77 - 5.28 x10E6/uL   Hemoglobin 12.7 11.1 - 15.9 g/dL   Hematocrit 59.9 65.9 - 46.6 %   MCV 86 79 - 97 fL   MCH 27.2 26.6 - 33.0 pg   MCHC 31.8 31.5 - 35.7 g/dL   RDW 87.5 88.2 - 84.5 %   Platelets 318 150 - 450 x10E3/uL   Neutrophils 58 Not Estab. %   Lymphs 27 Not Estab. %   Monocytes 13 Not Estab. %   Eos 1 Not Estab. %   Basos 1 Not Estab. %   Neutrophils Absolute 3.8 1.4 - 7.0 x10E3/uL   Lymphocytes Absolute 1.7 0.7 - 3.1 x10E3/uL   Monocytes Absolute 0.8 0.1 - 0.9 x10E3/uL   EOS (ABSOLUTE) 0.1 0.0 - 0.4 x10E3/uL   Basophils Absolute 0.1 0.0 - 0.2 x10E3/uL   Immature Granulocytes 0 Not Estab. %   Immature Grans (Abs) 0.0 0.0 - 0.1 x10E3/uL  Lipid Panel w/o Chol/HDL Ratio   Collection Time: 01/25/24 11:49 AM  Result Value Ref Range   Cholesterol, Total 213 (H) 100 - 199 mg/dL   Triglycerides 834 (H) 0 - 149 mg/dL   HDL 64 >60 mg/dL   VLDL Cholesterol Cal 29 5 - 40 mg/dL   LDL Chol Calc (NIH) 879 (H) 0 - 99 mg/dL  VITAMIN D  25 Hydroxy (Vit-D Deficiency, Fractures)   Collection Time: 01/25/24 11:49 AM  Result Value Ref Range   Vit D, 25-Hydroxy 11.0 (L) 30.0 - 100.0 ng/mL      Assessment & Plan:   Problem List Items Addressed This Visit       Musculoskeletal and Integument   Skin cyst   To right side of occiput. No s/s infection and is mobile. Suspect sebaceous cyst. Monitor and if any changes send to general surgery.        Other   Acute right-sided low back pain without sciatica - Primary   Acute for 3 days to right lower back with some radiation around to front. UA and wet prep all negative. Suspect this is more muscular in nature. Overall exam reassuring and no red flags. Will send in refills on Tizanidine  (she is aware not to  take while working or driving) and recommend she continue to use heat as needed + add on Icy/Hot lidocaine  patches and Voltaren  gel. Gentle stretching at home. Return if worsening or ongoing symptoms. Strict ER precautions provided.      Relevant Medications   tiZANidine  (ZANAFLEX ) 4 MG tablet   Other Relevant Orders   WET PREP FOR TRICH, YEAST, CLUE   Urinalysis, Routine w reflex microscopic     Follow up plan: Return for as scheduled on the 21st.      "

## 2024-06-16 NOTE — Patient Instructions (Signed)
 Acute Back Pain, Adult Acute back pain is sudden and usually short-lived. It is often caused by an injury to the muscles and tissues in the back. The injury may result from: A muscle, tendon, or ligament getting overstretched or torn. Ligaments are tissues that connect bones to each other. Lifting something improperly can cause a back strain. Wear and tear (degeneration) of the spinal disks. Spinal disks are circular tissue that provide cushioning between the bones of the spine (vertebrae). Twisting motions, such as while playing sports or doing yard work. A hit to the back. Arthritis. You may have a physical exam, lab tests, and imaging tests to find the cause of your pain. Acute back pain usually goes away with rest and home care. Follow these instructions at home: Managing pain, stiffness, and swelling Take over-the-counter and prescription medicines only as told by your health care provider. Treatment may include medicines for pain and inflammation that are taken by mouth or applied to the skin, or muscle relaxants. Your health care provider may recommend applying ice during the first 24-48 hours after your pain starts. To do this: Put ice in a plastic bag. Place a towel between your skin and the bag. Leave the ice on for 20 minutes, 2-3 times a day. Remove the ice if your skin turns bright red. This is very important. If you cannot feel pain, heat, or cold, you have a greater risk of damage to the area. If directed, apply heat to the affected area as often as told by your health care provider. Use the heat source that your health care provider recommends, such as a moist heat pack or a heating pad. Place a towel between your skin and the heat source. Leave the heat on for 20-30 minutes. Remove the heat if your skin turns bright red. This is especially important if you are unable to feel pain, heat, or cold. You have a greater risk of getting burned. Activity  Do not stay in bed. Staying in  bed for more than 1-2 days can delay your recovery. Sit up and stand up straight. Avoid leaning forward when you sit or hunching over when you stand. If you work at a desk, sit close to it so you do not need to lean over. Keep your chin tucked in. Keep your neck drawn back, and keep your elbows bent at a 90-degree angle (right angle). Sit high and close to the steering wheel when you drive. Add lower back (lumbar) support to your car seat, if needed. Take short walks on even surfaces as soon as you are able. Try to increase the length of time you walk each day. Do not sit, drive, or stand in one place for more than 30 minutes at a time. Sitting or standing for long periods of time can put stress on your back. Do not drive or use heavy machinery while taking prescription pain medicine. Use proper lifting techniques. When you bend and lift, use positions that put less stress on your back: [place] your knees. Keep the load close to your body. Avoid twisting. Exercise regularly as told by your health care provider. Exercising helps your back heal faster and helps prevent back injuries by keeping muscles strong and flexible. Work with a physical therapist to make a safe exercise program, as recommended by your health care provider. Do any exercises as told by your physical therapist. Lifestyle Maintain a healthy weight. Extra weight puts stress on your back and makes it difficult to have good  posture. Avoid activities or situations that make you feel anxious or stressed. Stress and anxiety increase muscle tension and can make back pain worse. Learn ways to manage anxiety and stress, such as through exercise. General instructions Sleep on a firm mattress in a comfortable position. Try lying on your side with your knees slightly bent. If you lie on your back, put a pillow under your knees. Keep your head and neck in a straight line with your spine (neutral position) when using electronic equipment like  smartphones or pads. To do this: Raise your smartphone or pad to look at it instead of bending your head or neck to look down. Put the smartphone or pad at the level of your face while looking at the screen. Follow your treatment plan as told by your health care provider. This may include: Cognitive or behavioral therapy. Acupuncture or massage therapy. Meditation or yoga. Contact a health care provider if: You have pain that is not relieved with rest or medicine. You have increasing pain going down into your legs or buttocks. Your pain does not improve after 2 weeks. You have pain at night. You lose weight without trying. You have a fever or chills. You develop nausea or vomiting. You develop abdominal pain. Get help right away if: You develop new bowel or bladder control problems. You have unusual weakness or numbness in your arms or legs. You feel faint. These symptoms may represent a serious problem that is an emergency. Do not wait to see if the symptoms will go away. Get medical help right away. Call your local emergency services (911 in the [place] Do not drive yourself to the hospital. Summary Acute back pain is sudden and usually short-lived. Use proper lifting techniques. When you bend and lift, use positions that put less stress on your back. Take over-the-counter and prescription medicines only as told by your health care provider, and apply heat or ice as told. This information is not intended to replace advice given to you by your health care provider. Make sure you discuss any questions you have with your health care provider. Document Revised: 08/09/2020 Document Reviewed: 08/09/2020 Elsevier Patient Education  2024 [employer].

## 2024-06-16 NOTE — Assessment & Plan Note (Signed)
 To right side of occiput. No s/s infection and is mobile. Suspect sebaceous cyst. Monitor and if any changes send to general surgery.

## 2024-06-17 NOTE — Patient Instructions (Signed)
 Be Involved in Caring For Your Health:  Taking Medications When medications are taken as directed, they can greatly improve your health. But if they are not taken as prescribed, they may not work. In some cases, not taking them correctly can be harmful. To help ensure your treatment remains effective and safe, understand your medications and how to take them. Bring your medications to each visit for review by your provider.  Your lab results, notes, and after visit summary will be available on My Chart. We strongly encourage you to use this feature. If lab results are abnormal the clinic will contact you with the appropriate steps. If the clinic does not contact you assume the results are satisfactory. You can always view your results on My Chart. If you have questions regarding your health or results, please contact the clinic during office hours. You can also ask questions on My Chart.  We at [facility] are grateful that you chose us  to provide your care. We strive to provide evidence-based and compassionate care and are always looking for feedback. If you get a survey from the clinic please complete this so we can hear your opinions.  Healthy Eating, Adult Healthy eating may help you get and keep a healthy body weight, reduce the risk of chronic disease, and live a long and productive life. It is important to follow a healthy eating pattern. Your nutritional and calorie needs should be met mainly by different nutrient-rich foods. What are tips for following this plan? Reading food labels Read labels and choose the following: Reduced or low sodium products. Juices with 100% fruit juice. Foods with low saturated fats (<3 g per serving) and high polyunsaturated and monounsaturated fats. Foods with whole grains, such as whole wheat, cracked wheat, brown rice, and wild rice. Whole grains that are fortified with folic acid. This is recommended for females who are pregnant or who want to  become pregnant. Read labels and do not eat or drink the following: Foods or drinks with added sugars. These include foods that contain brown sugar, corn sweetener, corn syrup, dextrose , fructose, glucose, high-fructose corn syrup, honey, invert sugar, lactose, malt syrup, maltose, molasses, raw sugar, sucrose, trehalose, or turbinado sugar. Limit your intake of added sugars to less than 10% of your total daily calories. Do not eat more than the following amounts of added sugar per day: 6 teaspoons (25 g) for females. 9 teaspoons (38 g) for males. Foods that contain processed or refined starches and grains. Refined grain products, such as white flour, degermed cornmeal, white bread, and white rice. Shopping Choose nutrient-rich snacks, such as vegetables, whole fruits, and nuts. Avoid high-calorie and high-sugar snacks, such as potato chips, fruit snacks, and candy. Use oil-based dressings and spreads on foods instead of solid fats such as butter, margarine, sour cream, or cream cheese. Limit pre-made sauces, mixes, and instant products such as flavored rice, instant noodles, and ready-made pasta. Try more plant-protein sources, such as tofu, tempeh, black beans, edamame, lentils, nuts, and seeds. Explore eating plans such as the Mediterranean diet or vegetarian diet. Try heart-healthy dips made with beans and healthy fats like hummus and guacamole. Vegetables go great with these. Cooking Use oil to saut or stir-fry foods instead of solid fats such as butter, margarine, or lard. Try baking, boiling, grilling, or broiling instead of frying. Remove the fatty part of meats before cooking. Steam vegetables in water  or broth. Meal planning  At meals, imagine dividing your plate into fourths: One-half of  your plate is fruits and vegetables. One-fourth of your plate is whole grains. One-fourth of your plate is protein, especially lean meats, poultry, eggs, tofu, beans, or nuts. Include low-fat  dairy as part of your daily diet. Lifestyle Choose healthy options in all settings, including home, work, school, restaurants, or stores. Prepare your food safely: Wash your hands after handling raw meats. Where you prepare food, keep surfaces clean by regularly washing with hot, soapy water . Keep raw meats separate from ready-to-eat foods, such as fruits and vegetables. Cook seafood, meat, poultry, and eggs to the recommended temperature. Get a food thermometer. Store foods at safe temperatures. In general: Keep cold foods at 84F (4.4C) or below. Keep hot foods at 184F (60C) or above. Keep your freezer at [facility] (-17.8C) or below. Foods are not safe to eat if they have been between the temperatures of 40-184F (4.4-60C) for more than 2 hours. What foods should I eat? Fruits Aim to eat 1-2 cups of fresh, canned (in natural juice), or frozen fruits each day. One cup of fruit equals 1 small apple, 1 large banana, 8 large strawberries, 1 cup (237 g) canned fruit,  cup (82 g) dried fruit, or 1 cup (240 mL) 100% juice. Vegetables Aim to eat 2-4 cups of fresh and frozen vegetables each day, including different varieties and colors. One cup of vegetables equals 1 cup (91 g) broccoli or cauliflower florets, 2 medium carrots, 2 cups (150 g) raw, leafy greens, 1 large tomato, 1 large bell pepper, 1 large sweet potato, or 1 medium white potato. Grains Aim to eat 5-10 ounce-equivalents of whole grains each day. Examples of 1 ounce-equivalent of grains include 1 slice of bread, 1 cup (40 g) ready-to-eat cereal, 3 cups (24 g) popcorn, or  cup (93 g) cooked rice. Meats and other proteins Try to eat 5-7 ounce-equivalents of protein each day. Examples of 1 ounce-equivalent of protein include 1 egg,  oz nuts (12 almonds, 24 pistachios, or 7 walnut halves), 1/4 cup (90 g) cooked beans, 6 tablespoons (90 g) hummus or 1 tablespoon (16 g) peanut butter. A cut of meat or fish that is the size of a deck of  cards is about 3-4 ounce-equivalents (85 g). Of the protein you eat each week, try to have at least 8 sounce (227 g) of seafood. This is about 2 servings per week. This includes salmon, trout, herring, sardines, and anchovies. Dairy Aim to eat 3 cup-equivalents of fat-free or low-fat dairy each day. Examples of 1 cup-equivalent of dairy include 1 cup (240 mL) milk, 8 ounces (250 g) yogurt, 1 ounces (44 g) natural cheese, or 1 cup (240 mL) fortified soy milk. Fats and oils Aim for about 5 teaspoons (21 g) of fats and oils per day. Choose monounsaturated fats, such as canola and olive oils, mayonnaise made with olive oil or avocado oil, avocados, peanut butter, and most nuts, or polyunsaturated fats, such as sunflower, corn, and soybean oils, walnuts, pine nuts, sesame seeds, sunflower seeds, and flaxseed. Beverages Aim for 6 eight-ounce glasses of water  per day. Limit coffee to 3-5 eight-ounce cups per day. Limit caffeinated beverages that have added calories, such as soda and energy drinks. If you drink alcohol: Limit how much you have to: 0-1 drink a day if you are female. 0-2 drinks a day if you are female. Know how much alcohol is in your drink. In the [place] one drink is one 12 oz bottle of beer (355 mL), one 5 oz glass of wine (  148 mL), or one 1 oz glass of hard liquor (44 mL). Seasoning and other foods Try not to add too much salt to your food. Try using herbs and spices instead of salt. Try not to add sugar to food. This information is based on [place] nutrition guidelines. To learn more, visit [employer]. Exact amounts may vary. You may need different amounts. This information is not intended to replace advice given to you by your health care provider. Make sure you discuss any questions you have with your health care provider. Document Revised: 02/16/2022 Document Reviewed: 02/16/2022 Elsevier Patient Education  2024 [employer].

## 2024-06-21 ENCOUNTER — Ambulatory Visit (INDEPENDENT_AMBULATORY_CARE_PROVIDER_SITE_OTHER): Admitting: Nurse Practitioner

## 2024-06-21 ENCOUNTER — Encounter: Payer: Self-pay | Admitting: Nurse Practitioner

## 2024-06-21 VITALS — BP 126/73 | HR 60 | Temp 97.5°F | Ht 65.0 in | Wt 181.0 lb

## 2024-06-21 DIAGNOSIS — R011 Cardiac murmur, unspecified: Secondary | ICD-10-CM | POA: Diagnosis not present

## 2024-06-21 DIAGNOSIS — Z6831 Body mass index (BMI) 31.0-31.9, adult: Secondary | ICD-10-CM | POA: Diagnosis not present

## 2024-06-21 DIAGNOSIS — E041 Nontoxic single thyroid nodule: Secondary | ICD-10-CM | POA: Diagnosis not present

## 2024-06-21 DIAGNOSIS — E6609 Other obesity due to excess calories: Secondary | ICD-10-CM | POA: Diagnosis not present

## 2024-06-21 DIAGNOSIS — Z0000 Encounter for general adult medical examination without abnormal findings: Secondary | ICD-10-CM | POA: Diagnosis not present

## 2024-06-21 DIAGNOSIS — E782 Mixed hyperlipidemia: Secondary | ICD-10-CM

## 2024-06-21 DIAGNOSIS — I10 Essential (primary) hypertension: Secondary | ICD-10-CM | POA: Diagnosis not present

## 2024-06-21 DIAGNOSIS — E559 Vitamin D deficiency, unspecified: Secondary | ICD-10-CM

## 2024-06-21 DIAGNOSIS — E66811 Obesity, class 1: Secondary | ICD-10-CM

## 2024-06-21 MED ORDER — AMLODIPINE BESYLATE 10 MG PO TABS: 10.0000 mg | ORAL_TABLET | Freq: Every day | ORAL | 4 refills | Status: AC

## 2024-06-21 MED ORDER — CHLORTHALIDONE 25 MG PO TABS: 25.0000 mg | ORAL_TABLET | Freq: Every day | ORAL | 4 refills | Status: AC

## 2024-06-21 MED ORDER — LISINOPRIL 40 MG PO TABS: 40.0000 mg | ORAL_TABLET | Freq: Every day | ORAL | 4 refills | Status: AC

## 2024-06-21 NOTE — Assessment & Plan Note (Signed)
 Ongoing, recommend she restart Rosuvastatin  as ordered.  Check lipid panel today and will restart dependent on labs.

## 2024-06-21 NOTE — Progress Notes (Signed)
 "   Chief Complaint  Patient presents with   Welcome to Medicare     Subjective:   Allison Reynolds is a 64 y.o. female who presents for a Welcome to Medicare Exam.   Visit info / Clinical Intake: Medicare Wellness Visit Type:: Welcome to [employer] GOVERNMENT SOCIAL RESEARCH OFFICER) Persons participating in visit and providing information:: patient Medicare Wellness Visit Mode:: In-person (required for WTM) Interpreter Needed?: No Pre-visit prep was completed: yes AWV questionnaire completed by patient prior to visit?: no Living arrangements:: lives with spouse/significant other Patient's Overall Health Status Rating: very good Typical amount of pain: none Does pain affect daily life?: no Are you currently prescribed opioids?: no  Dietary Habits and Nutritional Risks How many meals a day?: 2 Eats fruit and vegetables daily?: (!) no (couple times per week) Most meals are obtained by: preparing own meals In the last 2 weeks, have you had any of the following?: none Diabetic:: no  Functional Status Activities of Daily Living (to include ambulation/medication): Independent Ambulation: Independent Medication Administration: Independent Home Management (perform basic housework or laundry): Independent Manage your own finances?: yes Primary transportation is: driving Concerns about vision?: (!) yes Concerns about hearing?: no  Fall Screening Falls in the past year?: 0 Number of falls in past year: 0 Was there an injury with Fall?: 0 Fall Risk Category Calculator: 0 Patient Fall Risk Level: Low Fall Risk  Fall Risk Patient at Risk for Falls Due to: No Fall Risks Fall risk Follow up: Falls evaluation completed  Home and Transportation Safety: All rugs have non-skid backing?: N/A, no rugs All stairs or steps have railings?: yes Grab bars in the bathtub or shower?: (!) no Have non-skid surface in bathtub or shower?: yes Good home lighting?: yes Regular seat belt use?: yes Hospital stays in the last  year:: no  Cognitive Assessment Difficulty concentrating, remembering, or making decisions? : no Will 6CIT or Mini Cog be Completed: yes What year is it?: 0 points What month is it?: 0 points Give patient an address phrase to remember (5 components): [address] in [place], [place] About what time is it?: 0 points Count backwards from 20 to 1: 0 points Say the months of the year in reverse: 0 points Repeat the address phrase from earlier: 2 points 6 CIT Score: 2 points  Advance Directives (For Healthcare) Does Patient Have a Medical Advance Directive?: No  Reviewed/Updated  Reviewed/Updated: Reviewed All (Medical, Surgical, Family, Medications, Allergies, Care Teams, Patient Goals)    Allergies (verified) Patient has no known allergies.   Current Medications (verified) Outpatient Encounter Medications as of 06/21/2024  Medication Sig   amLODipine  (NORVASC ) 10 MG tablet Take 1 tablet (10 mg total) by mouth daily.   chlorthalidone  (HYGROTON ) 25 MG tablet Take 1 tablet (25 mg total) by mouth daily.   Cholecalciferol  (VITAMIN D3) 1.25 MG (50000 UT) CAPS Take 1 capsule by mouth once a week.   lisinopril  (ZESTRIL ) 40 MG tablet Take 1 tablet (40 mg total) by mouth daily.   tiZANidine  (ZANAFLEX ) 4 MG tablet Take 1 tablet (4 mg total) by mouth every 6 (six) hours as needed for muscle spasms.   No facility-administered encounter medications on file as of 06/21/2024.    History: Past Medical History:  Diagnosis Date   Arthritis    Heart murmur    Hypertension    Thyroid  disease    Past Surgical History:  Procedure Laterality Date   BREAST BIOPSY Right 2016   benign   CESAREAN SECTION  1981,  1983   TOTAL ABDOMINAL HYSTERECTOMY  age 76   Family History  Problem Relation Age of Onset   Hypertension Mother    Hypertension Father    Heart murmur Father    Hypertension Daughter    Lupus Paternal Aunt    Social History   Occupational History   Not on file  Tobacco Use    Smoking status: Some Days    Types: Cigarettes   Smokeless tobacco: Never   Tobacco comments:    1 pack per week  Vaping Use   Vaping status: Never Used  Substance and Sexual Activity   Alcohol use: Yes    Comment: occasionally   Drug use: No   Sexual activity: Yes   Tobacco Counseling Ready to quit: Not Answered Counseling given: Not Answered Tobacco comments: 1 pack per week  SDOH Screenings   Food Insecurity: No Food Insecurity (06/21/2024)  Housing: Low Risk (06/21/2024)  Transportation Needs: No Transportation Needs (06/21/2024)  Utilities: Not At Risk (06/21/2024)  Depression (PHQ2-9): Low Risk (06/16/2024)  Financial Resource Strain: Low Risk  (08/02/2023)   [facility] from [facility]  Physical Activity: Inactive (06/21/2024)  Social Connections: Socially Isolated (06/21/2024)  Stress: No Stress Concern Present (06/21/2024)  Tobacco Use: High Risk (06/21/2024)  Health Literacy: Adequate Health Literacy (06/21/2024)   See flowsheets for full screening details  Depression Screen PHQ 2 & 9 Depression Scale- Over the past 2 weeks, how often have you been bothered by any of the following problems? Little interest or pleasure in doing things: 0 Feeling down, depressed, or hopeless (PHQ Adolescent also includes...irritable): 0 PHQ-2 Total Score: 0 Trouble falling or staying asleep, or sleeping too much: 0 Feeling tired or having little energy: 0 Poor appetite or overeating (PHQ Adolescent also includes...weight loss): 0 Feeling bad about yourself - or that you are a failure or have let yourself or your family down: 0 Trouble concentrating on things, such as reading the newspaper or watching television (PHQ Adolescent also includes...like school work): 0 Moving or speaking so slowly that other people could have noticed. Or the opposite - being so fidgety or restless that you have been moving around a lot more than usual: 0 Thoughts that you would be better off dead,  or of hurting yourself in some way: 0 PHQ-9 Total Score: 0 If you checked off any problems, how difficult have these problems made it for you to do your work, take care of things at home, or get along with other people?: Not difficult at all      Goals Addressed   None          Objective:    Today's Vitals   06/21/24 1453  BP: 126/73  Pulse: 60  Temp: (!) 97.5 F (36.4 C)  TempSrc: Oral  SpO2: 98%  Weight: 181 lb (82.1 kg)  Height: 5' 5 (1.651 m)  PainSc: 0-No pain   Body mass index is 30.12 kg/m.   Physical Exam Vitals and nursing note reviewed. Exam conducted with a chaperone present.  Constitutional:      General: She is awake. She is not in acute distress.    Appearance: She is well-developed and well-groomed. She is obese. She is not ill-appearing or toxic-appearing.  HENT:     Head: Normocephalic and atraumatic.     Right Ear: Hearing, tympanic membrane, ear canal and external ear normal. No drainage.     Left Ear: Hearing, tympanic membrane, ear canal and external ear normal. No  drainage.     Nose: Nose normal.     Right Sinus: No maxillary sinus tenderness or frontal sinus tenderness.     Left Sinus: No maxillary sinus tenderness or frontal sinus tenderness.     Mouth/Throat:     Mouth: Mucous membranes are moist.     Pharynx: Oropharynx is clear. Uvula midline. No pharyngeal swelling, oropharyngeal exudate or posterior oropharyngeal erythema.  Eyes:     General: Lids are normal.        Right eye: No discharge.        Left eye: No discharge.     Extraocular Movements: Extraocular movements intact.     Conjunctiva/sclera: Conjunctivae normal.     Pupils: Pupils are equal, round, and reactive to light.     Visual Fields: Right eye visual fields normal and left eye visual fields normal.  Neck:     Thyroid : No thyromegaly.     Vascular: No carotid bruit.     Trachea: Trachea normal.  Cardiovascular:     Rate and Rhythm: Normal rate and regular rhythm.      Heart sounds: Murmur heard.     Systolic murmur is present with a grade of 2/6.     No gallop.  Pulmonary:     Effort: Pulmonary effort is normal. No accessory muscle usage or respiratory distress.     Breath sounds: Normal breath sounds.  Chest:  Breasts:    Right: Normal.     Left: Normal.  Abdominal:     General: Bowel sounds are normal.     Palpations: Abdomen is soft. There is no hepatomegaly or splenomegaly.     Tenderness: There is no abdominal tenderness.  Musculoskeletal:        General: Normal range of motion.     Cervical back: Normal range of motion and neck supple.     Right lower leg: No edema.     Left lower leg: No edema.  Lymphadenopathy:     Head:     Right side of head: No submental, submandibular, tonsillar, preauricular or posterior auricular adenopathy.     Left side of head: No submental, submandibular, tonsillar, preauricular or posterior auricular adenopathy.     Cervical: No cervical adenopathy.     Upper Body:     Right upper body: No supraclavicular, axillary or pectoral adenopathy.     Left upper body: No supraclavicular, axillary or pectoral adenopathy.  Skin:    General: Skin is warm and dry.     Capillary Refill: Capillary refill takes less than 2 seconds.     Findings: No rash.  Neurological:     Mental Status: She is alert and oriented to person, place, and time.     Gait: Gait is intact.     Deep Tendon Reflexes: Reflexes are normal and symmetric.     Reflex Scores:      Brachioradialis reflexes are 2+ on the right side and 2+ on the left side.      Patellar reflexes are 2+ on the right side and 2+ on the left side. Psychiatric:        Attention and Perception: Attention normal.        Mood and Affect: Mood normal.        Speech: Speech normal.        Behavior: Behavior normal. Behavior is cooperative.        Thought Content: Thought content normal.        Judgment: Judgment normal.   {(  optional), or other factors deemed appropriate  based on the beneficiary's medical and social history and current clinical standards.    Hearing/Vision screen Hearing Screening   500Hz  1000Hz  2000Hz  4000Hz   Right ear 25 20 20 25   Left ear 40 25 25 40   Vision Screening   Right eye Left eye Both eyes  Without correction 20/200 20/50 75  With correction      Immunizations and Health Maintenance Health Maintenance  Topic Date Due   DTaP/Tdap/Td (2 - Td or Tdap) 07/06/2024 (Originally 03/16/2022)   Colonoscopy  08/23/2024 (Originally 01/26/2004)   Influenza Vaccine  08/29/2024 (Originally 12/31/2023)   Bone Density Scan  09/12/2024 (Originally 01/26/2024)   Pneumococcal Vaccine: 50+ Years (1 of 2 - PCV) 06/16/2025 (Originally 01/25/1978)   Medicare Annual Wellness (AWV)  06/21/2025   Mammogram  05/10/2026   Hepatitis C Screening  Completed   HIV Screening  Completed   Hepatitis B Vaccines 19-59 Average Risk  Aged Out   Meningococcal B Vaccine  Aged Out   COVID-19 Vaccine  Discontinued   Zoster Vaccines- Shingrix  Discontinued    EKG: normal EKG, normal sinus rhythm, unchanged from previous tracings     Assessment/Plan:  This is a routine wellness examination for Allison Reynolds.  Patient Care Team: Anuja Manka T, NP as PCP - General (Nurse Practitioner)  I have personally reviewed and noted the following in the patients chart:   Medical and social history Use of alcohol, tobacco or illicit drugs  Current medications and supplements including opioid prescriptions. Functional ability and status Nutritional status Physical activity Advanced directives List of other physicians Hospitalizations, surgeries, and ER visits in previous 12 months Vitals Screenings to include cognitive, depression, and falls Referrals and appointments  Orders Placed This Encounter  Procedures   CBC with Differential/Platelet   Comprehensive metabolic panel with GFR   Lipid Panel w/o Chol/HDL Ratio   TSH   VITAMIN D  25 Hydroxy (Vit-D  Deficiency, Fractures)   EKG 12-Lead   In addition, I have reviewed and discussed with patient certain preventive protocols, quality metrics, and best practice recommendations. A written personalized care plan for preventive services as well as general preventive health recommendations were provided to patient.   Laymon LOISE [place], [facility]   06/21/2024   No follow-ups on file.  "

## 2024-06-21 NOTE — Assessment & Plan Note (Signed)
Stable, asymptomatic.  Will obtain echo if symptoms present. ?

## 2024-06-21 NOTE — Assessment & Plan Note (Signed)
 Ongoing, noted on past labs.  Continue taking Vitamin D2 2000 units at home.  Check level today. Will order DEXA at next visit.

## 2024-06-21 NOTE — Assessment & Plan Note (Signed)
 Chronic, ongoing.  BP at goal in office today.  Recommend she monitor BP at least a few mornings a week at home and document.  DASH diet at home.  Continue current medication regimen and adjust as needed, refills sent in and to take consistently. LABS: CBC, CMP, TSH.

## 2024-06-21 NOTE — Assessment & Plan Note (Signed)
BMI 30.12.  Recommended eating smaller high protein, low fat meals more frequently and exercising 30 mins a day 5 times a week with a goal of 10-15lb weight loss in the next 3 months. Patient voiced their understanding and motivation to adhere to these recommendations.  

## 2024-06-22 ENCOUNTER — Ambulatory Visit: Payer: Self-pay | Admitting: Nurse Practitioner

## 2024-06-22 LAB — CBC WITH DIFFERENTIAL/PLATELET
Basophils Absolute: 0.1 x10E3/uL (ref 0.0–0.2)
Basos: 1 %
EOS (ABSOLUTE): 0.1 x10E3/uL (ref 0.0–0.4)
Eos: 2 %
Hematocrit: 39.4 % (ref 34.0–46.6)
Hemoglobin: 12.6 g/dL (ref 11.1–15.9)
Immature Grans (Abs): 0 x10E3/uL (ref 0.0–0.1)
Immature Granulocytes: 0 %
Lymphocytes Absolute: 2.3 x10E3/uL (ref 0.7–3.1)
Lymphs: 37 %
MCH: 27.6 pg (ref 26.6–33.0)
MCHC: 32 g/dL (ref 31.5–35.7)
MCV: 86 fL (ref 79–97)
Monocytes Absolute: 0.6 x10E3/uL (ref 0.1–0.9)
Monocytes: 10 %
Neutrophils Absolute: 3.1 x10E3/uL (ref 1.4–7.0)
Neutrophils: 50 %
Platelets: 320 x10E3/uL (ref 150–450)
RBC: 4.56 x10E6/uL (ref 3.77–5.28)
RDW: 12.5 % (ref 11.7–15.4)
WBC: 6.2 x10E3/uL (ref 3.4–10.8)

## 2024-06-22 LAB — COMPREHENSIVE METABOLIC PANEL WITH GFR
ALT: 20 IU/L (ref 0–32)
AST: 23 IU/L (ref 0–40)
Albumin: 4.8 g/dL (ref 3.9–4.9)
Alkaline Phosphatase: 67 IU/L (ref 49–135)
BUN/Creatinine Ratio: 16 (ref 12–28)
BUN: 13 mg/dL (ref 8–27)
Bilirubin Total: 0.4 mg/dL (ref 0.0–1.2)
CO2: 22 mmol/L (ref 20–29)
Calcium: 9.8 mg/dL (ref 8.7–10.3)
Chloride: 93 mmol/L — ABNORMAL LOW (ref 96–106)
Creatinine, Ser: 0.8 mg/dL (ref 0.57–1.00)
Globulin, Total: 2.8 g/dL (ref 1.5–4.5)
Glucose: 77 mg/dL (ref 70–99)
Potassium: 3.7 mmol/L (ref 3.5–5.2)
Sodium: 133 mmol/L — ABNORMAL LOW (ref 134–144)
Total Protein: 7.6 g/dL (ref 6.0–8.5)
eGFR: 82 mL/min/1.73

## 2024-06-22 LAB — LIPID PANEL W/O CHOL/HDL RATIO
Cholesterol, Total: 206 mg/dL — ABNORMAL HIGH (ref 100–199)
HDL: 68 mg/dL
LDL Chol Calc (NIH): 123 mg/dL — ABNORMAL HIGH (ref 0–99)
Triglycerides: 87 mg/dL (ref 0–149)
VLDL Cholesterol Cal: 15 mg/dL (ref 5–40)

## 2024-06-22 LAB — VITAMIN D 25 HYDROXY (VIT D DEFICIENCY, FRACTURES): Vit D, 25-Hydroxy: 53.5 ng/mL (ref 30.0–100.0)

## 2024-06-22 LAB — TSH: TSH: 1.05 u[IU]/mL (ref 0.450–4.500)

## 2024-06-22 NOTE — Progress Notes (Signed)
 Good afternoon crew, please let Adali know her labs have returned: - Sodium level remains a little low. At times Chlorthalidone  can lower these levels. Please add a little tablet salt to diet daily and if remains low next visit we may need to adjust blood pressure medications. - Kidney and liver function are normal. - Lipid panel continues to show elevations, I do highly recommend starting a low dose of Rosuvastatin  to bring level down and help prevent stroke. Do you want to try this? - Remainder of labs are normal. Any questions? Keep being stellar!!  Thank you for allowing me to participate in your care.  I appreciate you. Kindest regards, Azara Gemme

## 2024-06-23 NOTE — Telephone Encounter (Unsigned)
 Copied from CRM #[id number]. Topic: Clinical - Lab/Test Results >> Jun 22, 2024  5:07 PM Allison Reynolds wrote: Reason for CRM: Patient has returned call regarding lab results. She did one additional question regarding the recommended medication Rosuvastatin  for her lipid panel and wants to know if she does not want to take that medication what can she do within her diet to make the elevation go down. Please contact her back to discuss.   Phone [phone]

## 2024-12-20 ENCOUNTER — Ambulatory Visit: Admitting: Nurse Practitioner
